# Patient Record
Sex: Female | Born: 1980 | Race: Black or African American | Hispanic: No | Marital: Single | Smoking: Never smoker
Health system: Southern US, Community
[De-identification: ages and names within clinical notes are randomized; demographics above are authoritative.]

---

## 2004-02-27 ENCOUNTER — Emergency Department: Payer: Self-pay | Admitting: General Practice

## 2004-09-19 ENCOUNTER — Emergency Department: Payer: Self-pay | Admitting: Emergency Medicine

## 2014-01-12 ENCOUNTER — Emergency Department: Payer: Self-pay | Admitting: Emergency Medicine

## 2014-01-15 LAB — BETA STREP CULTURE(ARMC)

## 2014-03-05 ENCOUNTER — Emergency Department: Payer: Self-pay | Admitting: Emergency Medicine

## 2015-01-11 ENCOUNTER — Emergency Department
Admission: EM | Admit: 2015-01-11 | Discharge: 2015-01-11 | Disposition: A | Discharge status: Home / Self Care | Payer: Medicaid Other | Attending: Student | Admitting: Student

## 2015-01-11 ENCOUNTER — Encounter: Payer: Self-pay | Admitting: Emergency Medicine

## 2015-01-11 DIAGNOSIS — J020 Streptococcal pharyngitis: Secondary | ICD-10-CM | POA: Diagnosis not present

## 2015-01-11 DIAGNOSIS — J029 Acute pharyngitis, unspecified: Secondary | ICD-10-CM | POA: Diagnosis present

## 2015-01-11 MED ORDER — AMOXICILLIN 500 MG PO CAPS
500.0000 mg | ORAL_CAPSULE | Freq: Once | ORAL | Status: AC
  Administered 2015-01-11: 500 mg via ORAL
  Filled 2015-01-11: qty 1

## 2015-01-11 MED ORDER — ACETAMINOPHEN 325 MG PO TABS
ORAL_TABLET | ORAL | Status: AC
  Administered 2015-01-11: 650 mg via ORAL
  Filled 2015-01-11: qty 2

## 2015-01-11 MED ORDER — ACETAMINOPHEN 500 MG PO TABS: 500.0000 mg | ORAL_TABLET | Freq: Four times a day (QID) | ORAL | Status: AC | PRN

## 2015-01-11 MED ORDER — ACETAMINOPHEN 325 MG PO TABS
650.0000 mg | ORAL_TABLET | Freq: Once | ORAL | Status: AC | PRN
  Administered 2015-01-11: 650 mg via ORAL

## 2015-01-11 MED ORDER — AMOXICILLIN 500 MG PO CAPS: 500.0000 mg | ORAL_CAPSULE | Freq: Two times a day (BID) | ORAL | Status: DC

## 2015-01-11 NOTE — ED Provider Notes (Addendum)
[facility] Emergency Department Provider Note  ____________________________________________  Time seen: Approximately 1:52 AM  I have reviewed the triage vital signs and the nursing notes.   HISTORY  Chief Complaint Sore Throat    HPI Betty Frazier is a 34 y.o. female with no chronic medical problems who presents for evaluation of 24 hours severe throat pain, gradual onset, constant since onset, worse with eating/swallowing. She has also had runny nose, nonproductive cough, myalgias, subjective fever. Her daughter is here with similar complaints. No vomiting or diarrhea. No chest pain or difficulty breathing. No neck pain or neck stiffness.   History reviewed. No pertinent past medical history.  There are no active problems to display for this patient.   History reviewed. No pertinent past surgical history.  No current outpatient prescriptions on file.  Allergies Review of patient's allergies indicates no known allergies.  History reviewed. No pertinent family history.  Social History Social History  Substance Use Topics  . Smoking status: Never Smoker   . Smokeless tobacco: None  . Alcohol Use: Yes    Review of Systems Constitutional: + subjective fever/chills Eyes: No visual changes. ENT: + sore throat. Cardiovascular: Denies chest pain. Respiratory: Denies shortness of breath. Gastrointestinal: No abdominal pain.  No nausea, no vomiting.  No diarrhea.  No constipation. Genitourinary: Negative for dysuria. Musculoskeletal: Negative for back pain. Skin: Negative for rash. Neurological: Negative for headaches, focal weakness or numbness.  10-point ROS otherwise negative.  ____________________________________________   PHYSICAL EXAM:  VITAL SIGNS: ED Triage Vitals  Enc Vitals Group     BP 01/11/15 0031 157/74 mmHg     Pulse Rate 01/11/15 0031 109     Resp 01/11/15 0031 22     Temp 01/11/15 0031 101.3 F (38.5 C)     Temp  Source 01/11/15 0031 Oral     SpO2 01/11/15 0031 94 %     Weight 01/11/15 0031 316 lb (143.337 kg)     Height 01/11/15 0031 5\' 7"  (1.702 m)     Head Cir --      Peak Flow --      Pain Score 01/11/15 0031 8     Pain Loc --      Pain Edu? --      Excl. in GC? --     Constitutional: Alert and oriented. Fatigued but nontoxic appearing and in no acute distress. Eyes: Conjunctivae are normal. PERRL. EOMI. Head: Atraumatic. Nose: No congestion/rhinnorhea. Mouth/Throat: Mucous membranes are moist.  Oropharynx is erythematous without exudate. Neck: No stridor.  Supple without meningismus. Cardiovascular: tachycardic rate, regular rhythm. Grossly normal heart sounds.  Good peripheral circulation. Respiratory: Normal respiratory effort.  No retractions. Lungs CTAB. Gastrointestinal: Soft and nontender. No distention.  No CVA tenderness. Genitourinary: deferred Musculoskeletal: No lower extremity tenderness nor edema.  No joint effusions. Neurologic:  Normal speech and language. No gross focal neurologic deficits are appreciated. No gait instability. Skin:  Skin is warm, dry and intact. No rash noted. Psychiatric: Mood and affect are normal. Speech and behavior are normal.  ____________________________________________   LABS (all labs ordered are listed, but only abnormal results are displayed)  Labs Reviewed - No data to display ____________________________________________  EKG  none ____________________________________________  RADIOLOGY  none ____________________________________________   PROCEDURES  Procedure(s) performed: None  Critical Care performed: No  ____________________________________________   INITIAL IMPRESSION / ASSESSMENT AND PLAN / ED COURSE  Pertinent labs & imaging results that were available during my care of the patient were reviewed  by me and considered in my medical decision making (see chart for details).  Betty Frazier is a 34 y.o. female  with no chronic medical problems who presents for evaluation of 24 hours severe throat pain, gradual onset, constant since onset, worse with eating/swallowing. On exam, she is nontoxic appearing and in no acute distress. Neck supple without meningismus and I doubt meningitis in this patient. On arrival she was febrile, tachycardic and tachypneic, however this has resolved with Tylenol. She has severe erythema of the oropharynx, no uvular deviation, symmetric tonsillar pillars, not consistent with peritonsillar abscess. Her strep test is positive. She has declined Bicillin. Will treat with amoxicillin. Discussed return precautions, need for  PCP follow-up and she is comfortable with the discharge plan. ____________________________________________   FINAL CLINICAL IMPRESSION(S) / ED DIAGNOSES  Final diagnoses:  Streptococcal pharyngitis      Gayla Doss, MD 01/11/15 4098  Gayla Doss, MD 01/22/15 1016

## 2015-01-11 NOTE — ED Notes (Signed)
POCT rapid strep result is positive

## 2015-01-11 NOTE — ED Notes (Signed)
Pt reports body aches and sore throat at work states has gotten worse

## 2015-01-12 LAB — POCT RAPID STREP A: STREPTOCOCCUS, GROUP A SCREEN (DIRECT): POSITIVE — AB

## 2015-02-11 ENCOUNTER — Encounter: Payer: Self-pay | Admitting: Emergency Medicine

## 2015-02-11 ENCOUNTER — Emergency Department
Admission: EM | Admit: 2015-02-11 | Discharge: 2015-02-11 | Disposition: A | Discharge status: Home / Self Care | Payer: No Typology Code available for payment source | Attending: Emergency Medicine | Admitting: Emergency Medicine

## 2015-02-11 DIAGNOSIS — Z792 Long term (current) use of antibiotics: Secondary | ICD-10-CM | POA: Insufficient documentation

## 2015-02-11 DIAGNOSIS — Y92410 Unspecified street and highway as the place of occurrence of the external cause: Secondary | ICD-10-CM | POA: Insufficient documentation

## 2015-02-11 DIAGNOSIS — Y9389 Activity, other specified: Secondary | ICD-10-CM | POA: Insufficient documentation

## 2015-02-11 DIAGNOSIS — Y998 Other external cause status: Secondary | ICD-10-CM | POA: Insufficient documentation

## 2015-02-11 DIAGNOSIS — M62830 Muscle spasm of back: Secondary | ICD-10-CM | POA: Insufficient documentation

## 2015-02-11 DIAGNOSIS — S3992XA Unspecified injury of lower back, initial encounter: Secondary | ICD-10-CM | POA: Insufficient documentation

## 2015-02-11 MED ORDER — NAPROXEN 500 MG PO TABS: 500.0000 mg | ORAL_TABLET | Freq: Two times a day (BID) | ORAL | Status: DC

## 2015-02-11 MED ORDER — CYCLOBENZAPRINE HCL 10 MG PO TABS: 10.0000 mg | ORAL_TABLET | Freq: Three times a day (TID) | ORAL | Status: DC | PRN

## 2015-02-11 NOTE — Discharge Instructions (Signed)
Heat Therapy °Heat therapy can help ease sore, stiff, injured, and tight muscles and joints. Heat relaxes your muscles, which may help ease your pain.  °RISKS AND COMPLICATIONS °If you have any of the following conditions, do not use heat therapy unless your health care provider has approved: °· Poor circulation. °· Healing wounds or scarred skin in the area being treated. °· Diabetes, heart disease, or high blood pressure. °· Not being able to feel (numbness) the area being treated. °· Unusual swelling of the area being treated. °· Active infections. °· Blood clots. °· Cancer. °· Inability to communicate pain. This may include young children and people who have problems with their brain function (dementia). °· Pregnancy. °Heat therapy should only be used on old, pre-existing, or long-lasting (chronic) injuries. Do not use heat therapy on new injuries unless directed by your health care provider. °HOW TO USE HEAT THERAPY °There are several different kinds of heat therapy, including: °· Moist heat pack. °· Warm water bath. °· Hot water bottle. °· Electric heating pad. °· Heated gel pack. °· Heated wrap. °· Electric heating pad. °Use the heat therapy method suggested by your health care provider. Follow your health care provider's instructions on when and how to use heat therapy. °GENERAL HEAT THERAPY RECOMMENDATIONS °· Do not sleep while using heat therapy. Only use heat therapy while you are awake. °· Your skin may turn pink while using heat therapy. Do not use heat therapy if your skin turns red. °· Do not use heat therapy if you have new pain. °· High heat or long exposure to heat can cause burns. Be careful when using heat therapy to avoid burning your skin. °· Do not use heat therapy on areas of your skin that are already irritated, such as with a rash or sunburn. °SEEK MEDICAL CARE IF: °· You have blisters, redness, swelling, or numbness. °· You have new pain. °· Your pain is worse. °MAKE SURE  YOU: °· Understand these instructions. °· Will watch your condition. °· Will get help right away if you are not doing well or get worse. °  °This information is not intended to replace advice given to you by your health care provider. Make sure you discuss any questions you have with your health care provider. °  °Document Released: 05/09/2011 Document Revised: 03/07/2014 Document Reviewed: 04/09/2013 °Elsevier Interactive Patient Education ©2016 Elsevier Inc[employer] ° °Muscle Cramps and Spasms °Muscle cramps and spasms occur when a muscle or muscles tighten and you have no control over this tightening (involuntary muscle contraction). They are a common problem and can develop in any muscle. The most common place is in the calf muscles of the leg. Both muscle cramps and muscle spasms are involuntary muscle contractions, but they also have differences:  °· Muscle cramps are sporadic and painful. They may last a few seconds to a quarter of an hour. Muscle cramps are often more forceful and last longer than muscle spasms. °· Muscle spasms may or may not be painful. They may also last just a few seconds or much longer. °CAUSES  °It is uncommon for cramps or spasms to be due to a serious underlying problem. In many cases, the cause of cramps or spasms is unknown. Some common causes are:  °· Overexertion.   °· Overuse from repetitive motions (doing the same thing over and over).   °· Remaining in a certain position for a long period of time.   °· Improper preparation, form, or technique while performing a sport or activity.   °·   Dehydration.   °· Injury.   °· Side effects of some medicines.   °· Abnormally low levels of the salts and ions in your blood (electrolytes), especially potassium and calcium. This could happen if you are taking water pills (diuretics) or you are pregnant.   °Some underlying medical problems can make it more likely to develop cramps or spasms. These include, but are not limited to:  °· Diabetes.    °· Parkinson disease.   °· Hormone disorders, such as thyroid problems.   °· Alcohol abuse.   °· Diseases specific to muscles, joints, and bones.   °· Blood vessel disease where not enough blood is getting to the muscles.   °HOME CARE INSTRUCTIONS  °· Stay well hydrated. Drink enough water and fluids to keep your urine clear or pale yellow. °· It may be helpful to massage, stretch, and relax the affected muscle. °· For tight or tense muscles, use a warm towel, heating pad, or hot shower water directed to the affected area. °· If you are sore or have pain after a cramp or spasm, applying ice to the affected area may relieve discomfort. °¨ Put ice in a plastic bag. °¨ Place a towel between your skin and the bag. °¨ Leave the ice on for 15-20 minutes, 03-04 times a day. °· Medicines used to treat a known cause of cramps or spasms may help reduce their frequency or severity. Only take over-the-counter or prescription medicines as directed by your caregiver. °SEEK MEDICAL CARE IF:  °Your cramps or spasms get more severe, more frequent, or do not improve over time.  °MAKE SURE YOU:  °· Understand these instructions. °· Will watch your condition. °· Will get help right away if you are not doing well or get worse. °  °This information is not intended to replace advice given to you by your health care provider. Make sure you discuss any questions you have with your health care provider. °  °Document Released: 08/06/2001 Document Revised: 06/11/2012 Document Reviewed: 02/01/2012 °Elsevier Interactive Patient Education ©2016 Elsevier Inc. ° °
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
## 2015-04-19 ENCOUNTER — Encounter: Payer: Self-pay | Admitting: Emergency Medicine

## 2015-04-19 ENCOUNTER — Emergency Department
Admission: EM | Admit: 2015-04-19 | Discharge: 2015-04-19 | Disposition: A | Discharge status: Home / Self Care | Payer: Medicaid Other | Attending: Emergency Medicine | Admitting: Emergency Medicine

## 2015-04-19 DIAGNOSIS — Z791 Long term (current) use of non-steroidal anti-inflammatories (NSAID): Secondary | ICD-10-CM | POA: Diagnosis not present

## 2015-04-19 DIAGNOSIS — R52 Pain, unspecified: Secondary | ICD-10-CM | POA: Diagnosis present

## 2015-04-19 DIAGNOSIS — J111 Influenza due to unidentified influenza virus with other respiratory manifestations: Secondary | ICD-10-CM | POA: Diagnosis not present

## 2015-04-19 DIAGNOSIS — Z792 Long term (current) use of antibiotics: Secondary | ICD-10-CM | POA: Diagnosis not present

## 2015-04-19 MED ORDER — OSELTAMIVIR PHOSPHATE 75 MG PO CAPS: 75.0000 mg | ORAL_CAPSULE | Freq: Two times a day (BID) | ORAL | Status: DC

## 2015-04-19 MED ORDER — GUAIFENESIN-CODEINE 100-10 MG/5ML PO SOLN: 5.0000 mL | ORAL | Status: DC | PRN

## 2015-04-19 NOTE — Discharge Instructions (Signed)
Influenza, Adult Influenza (flu) is an infection in the mouth, nose, and throat (respiratory tract) caused by a virus. The flu can make you feel very ill. Influenza spreads easily from person to person (contagious).  HOME CARE   Only take medicines as told by your doctor.  Use a cool mist humidifier to make breathing easier.  Get plenty of rest until your fever goes away. This usually takes 3 to 4 days.  Drink enough fluids to keep your pee (urine) clear or pale yellow.  Cover your mouth and nose when you cough or sneeze.  Wash your hands well to avoid spreading the flu.  Stay home from work or school until your fever has been gone for at least 1 full day.  Get a flu shot every year. GET HELP RIGHT AWAY IF:   You have trouble breathing or feel short of breath.  Your skin or nails turn blue.  You have severe neck pain or stiffness.  You have a severe headache, facial pain, or earache.  Your fever gets worse or keeps coming back.  You feel sick to your stomach (nauseous), throw up (vomit), or have watery poop (diarrhea).  You have chest pain.  You have a deep cough that gets worse, or you cough up more thick spit (mucus). MAKE SURE YOU:   Understand these instructions.  Will watch your condition.  Will get help right away if you are not doing well or get worse.   This information is not intended to replace advice given to you by your health care provider. Make sure you discuss any questions you have with your health care provider.   Document Released: 11/24/2007 Document Revised: 03/07/2014 Document Reviewed: 05/16/2011 Elsevier Interactive Patient Education 2016 [employer].    Tylenol or ibuprofen as seen for fever. Drink plenty of fluids. Begin Tamiflu today. Robitussin-AC as needed for cough. Follow-up with your doctor if any continued problems.

## 2015-04-19 NOTE — ED Provider Notes (Signed)
[facility] Emergency Department Provider Note  ____________________________________________  Time seen: Approximately 4:28 PM  I have reviewed the triage vital signs and the nursing notes.   HISTORY  Chief Complaint Generalized Body Aches  HPI Betty Frazier is a 35 y.o. female is here with onset last night of generalized body aches and feeling feverish. Patient states that she lives with her sister who was seen at [facility] and was positive for the flu. Sister is currently taking Tamiflu.At this time she denies any vomiting, diarrhea, or urinary symptoms. "I just ache all over". She rates her pain as 9/10. She also states that coughing has Her up all night.   History reviewed. No pertinent past medical history.  There are no active problems to display for this patient.   History reviewed. No pertinent past surgical history.  Current Outpatient Rx  Name  Route  Sig  Dispense  Refill  . acetaminophen (TYLENOL) 500 MG tablet   Oral   Take 1 tablet (500 mg total) by mouth every 6 (six) hours as needed.   20 tablet   2   . amoxicillin (AMOXIL) 500 MG capsule   Oral   Take 1 capsule (500 mg total) by mouth 2 (two) times daily.   20 capsule   0   . cyclobenzaprine (FLEXERIL) 10 MG tablet   Oral   Take 1 tablet (10 mg total) by mouth 3 (three) times daily as needed for muscle spasms.   15 tablet   0   . guaiFENesin-codeine 100-10 MG/5ML syrup   Oral   Take 5 mLs by mouth every 4 (four) hours as needed for cough.   120 mL   0   . naproxen (NAPROSYN) 500 MG tablet   Oral   Take 1 tablet (500 mg total) by mouth 2 (two) times daily with a meal.   60 tablet   0   . oseltamivir (TAMIFLU) 75 MG capsule   Oral   Take 1 capsule (75 mg total) by mouth 2 (two) times daily.   10 capsule   0     Allergies Review of patient's allergies indicates no known allergies.  History reviewed. No pertinent family history.  Social History Social  History  Substance Use Topics  . Smoking status: Never Smoker   . Smokeless tobacco: None  . Alcohol Use: Yes    Review of Systems Constitutional: Unaware fever/positive chills Eyes: No visual changes. ENT: No sore throat. Cardiovascular: Denies chest pain. Respiratory: Denies shortness of breath. Nonproductive cough Gastrointestinal: No abdominal pain.  No nausea, no vomiting.  No diarrhea.   Genitourinary: Negative for dysuria. Musculoskeletal: Wise body aches positive. Skin: Negative for rash. Neurological: Positive for headaches, no focal weakness or numbness.  10-point ROS otherwise negative.  ____________________________________________   PHYSICAL EXAM:  VITAL SIGNS: ED Triage Vitals  Enc Vitals Group     BP 04/19/15 1516 158/98 mmHg     Pulse Rate 04/19/15 1516 99     Resp 04/19/15 1516 20     Temp 04/19/15 1516 99.2 F (37.3 C)     Temp Source 04/19/15 1516 Oral     SpO2 04/19/15 1516 99 %     Weight 04/19/15 1516 235 lb (106.595 kg)     Height 04/19/15 1516  (1.676 m)     Head Cir --      Peak Flow --      Pain Score 04/19/15 1515 9     Pain Loc --  Pain Edu? --      Excl. in GC? --     Constitutional: Alert and oriented. Well appearing and in no acute distress. Eyes: Conjunctivae are normal. PERRL. EOMI. Head: Atraumatic. Nose: No congestion/rhinnorhea.  EACs and TMs are clear bilaterally. Mouth/Throat: Mucous membranes are moist.  Oropharynx non-erythematous. Neck: No stridor.   Hematological/Lymphatic/Immunilogical: No cervical lymphadenopathy. Cardiovascular: Normal rate, regular rhythm. Grossly normal heart sounds.  Good peripheral circulation. Respiratory: Normal respiratory effort.  No retractions. Lungs CTAB. Gastrointestinal: Soft and nontender. No distention.  Musculoskeletal: Moves upper and lower extremities without any difficulty. Neurologic:  Normal speech and language. No gross focal neurologic deficits are appreciated. No gait  instability. Skin:  Skin is warm, dry and intact. No rash noted. Psychiatric: Mood and affect are normal. Speech and behavior are normal.  ____________________________________________   LABS (all labs ordered are listed, but only abnormal results are displayed)  Labs Reviewed - No data to display  PROCEDURES  Procedure(s) performed: None  Critical Care performed: No  ____________________________________________   INITIAL IMPRESSION / ASSESSMENT AND PLAN / ED COURSE  Pertinent labs & imaging results that were available during my care of the patient were reviewed by me and considered in my medical decision making (see chart for details).  Patient was started on Tamiflu due to the close proximity to a family member that is positive. Patient was also given a prescription for Robitussin-AC as needed for cough. She is encouraged to continue fluids and take Tylenol or ibuprofen as needed for fever and body aches. She is to follow-up with her doctor if any continued problems. ____________________________________________   FINAL CLINICAL IMPRESSION(S) / ED DIAGNOSES  Final diagnoses:  Influenza      Tommi Rumps, PA-C 04/19/15 1712  Sharman Cheek, MD 04/22/15 1221

## 2015-04-19 NOTE — ED Notes (Signed)
Pt c/o generalized body aches since last night. Sister who lives with pt has had flu. Denies fevers. Denies NVD.

## 2016-05-30 ENCOUNTER — Encounter: Payer: Self-pay | Admitting: Emergency Medicine

## 2016-05-30 ENCOUNTER — Emergency Department
Admission: EM | Admit: 2016-05-30 | Discharge: 2016-05-30 | Disposition: A | Discharge status: Home / Self Care | Payer: Medicaid Other | Attending: Student in an Organized Health Care Education/Training Program | Admitting: Student in an Organized Health Care Education/Training Program

## 2016-05-30 DIAGNOSIS — J069 Acute upper respiratory infection, unspecified: Secondary | ICD-10-CM

## 2016-05-30 DIAGNOSIS — R05 Cough: Secondary | ICD-10-CM | POA: Diagnosis present

## 2016-05-30 LAB — POCT RAPID STREP A: Streptococcus, Group A Screen (Direct): NEGATIVE

## 2016-05-30 MED ORDER — PREDNISONE 10 MG (21) PO TBPK: ORAL_TABLET | ORAL | 0 refills | Status: DC

## 2016-05-30 MED ORDER — AMOXICILLIN-POT CLAVULANATE 875-125 MG PO TABS: 1.0000 | ORAL_TABLET | Freq: Two times a day (BID) | ORAL | 0 refills | Status: AC

## 2016-05-30 NOTE — ED Notes (Signed)
See triage note  States she developed sinus pressure last week  States she took OTC sinus meds and felt better  But woke up with sore throat ,and soreness in neck this am    Positive hoarseness

## 2016-05-30 NOTE — ED Provider Notes (Signed)
[facility] Emergency Department Provider Note  ____________________________________________  Time seen: Approximately 8:22 AM  I have reviewed the triage vital signs and the nursing notes.   HISTORY  Chief Complaint Sore Throat    HPI Betty Frazier is a 36 y.o. female that presents to the emergency department with 3 weeks of congestion, 5 days of non productive cough, and one day of sore throat. Patient states that the congestion has come and go for 3 weeks. A couple of days ago she developed a nonproductive cough. This morning she woke up with a hoarse voice and a sore throat.She has taken over-the-counter cold medication for symptoms. She denies fever, chills, muscle aches, shortness of breath, chest pain, nausea, vomiting, abdominal pain.   History reviewed. No pertinent past medical history.  There are no active problems to display for this patient.   History reviewed. No pertinent surgical history.  Prior to Admission medications   Medication Sig Start Date End Date Taking? Authorizing Provider  amoxicillin-clavulanate (AUGMENTIN) 875-125 MG tablet Take 1 tablet by mouth 2 (two) times daily. 05/30/16 06/09/16  Enid Derry, PA-C  predniSONE (STERAPRED UNI-PAK 21 TAB) 10 MG (21) TBPK tablet Take 6 tablets on day 1, take 5 tablets on day 2, take 4 tablets on day 3, take 3 tablets on day 4, take 2 tablets on day 5, take 1 tablet on day 6 05/30/16   Enid Derry, PA-C    Allergies Patient has no known allergies.  No family history on file.  Social History Social History  Substance Use Topics  . Smoking status: Never Smoker  . Smokeless tobacco: Never Used  . Alcohol use Yes     Review of Systems  Constitutional: No fever/chills Eyes: No visual changes. No discharge. ENT: Positive for congestion and rhinorrhea. Cardiovascular: No chest pain. Respiratory: Positive for cough. No SOB. Gastrointestinal: No abdominal pain.  No nausea, no  vomiting.  No diarrhea.  No constipation. Musculoskeletal: Negative for musculoskeletal pain. Skin: Negative for rash, abrasions, lacerations, ecchymosis. Neurological: Negative for headaches.   ____________________________________________   PHYSICAL EXAM:  VITAL SIGNS: ED Triage Vitals [05/30/16 0752]  Enc Vitals Group     BP (!) 158/92     Pulse Rate 76     Resp 18     Temp 98.4 F (36.9 C)     Temp Source Oral     SpO2 96 %     Weight 240 lb (108.9 kg)     Height  (1.676 m)     Head Circumference      Peak Flow      Pain Score      Pain Loc      Pain Edu?      Excl. in GC?      Constitutional: Alert and oriented. Well appearing and in no acute distress. Eyes: Conjunctivae are normal. PERRL. EOMI. No discharge. Head: Atraumatic. ENT: No frontal and maxillary sinus tenderness.      Ears: Tympanic membranes pearly gray with good landmarks. No discharge.      Nose: Mild congestion/rhinnorhea.      Mouth/Throat: Mucous membranes are moist. Oropharynx non-erythematous. Tonsils not enlarged. No exudates. Uvula midline. Neck: No stridor.   Hematological/Lymphatic/Immunilogical: No cervical lymphadenopathy. Cardiovascular: Normal rate, regular rhythm.  Good peripheral circulation. Respiratory: Normal respiratory effort without tachypnea or retractions. Lungs CTAB. Good air entry to the bases with no decreased or absent breath sounds. Gastrointestinal: Bowel sounds 4 quadrants. Soft and nontender to palpation. No  guarding or rigidity. No palpable masses. No distention. Musculoskeletal: Full range of motion to all extremities. No gross deformities appreciated. Neurologic:  Normal speech and language. No gross focal neurologic deficits are appreciated.  Skin:  Skin is warm, dry and intact. No rash noted.   ____________________________________________   LABS (all labs ordered are listed, but only abnormal results are displayed)  Labs Reviewed  POCT RAPID STREP A    ____________________________________________  EKG   ____________________________________________  RADIOLOGY  No results found.  ____________________________________________    PROCEDURES  Procedure(s) performed:    Procedures    Medications - No data to display   ____________________________________________   INITIAL IMPRESSION / ASSESSMENT AND PLAN / ED COURSE  Pertinent labs & imaging results that were available during my care of the patient were reviewed by me and considered in my medical decision making (see chart for details).  Review of the  CSRS was performed in accordance of the [facility] prior to dispensing any controlled drugs.     Patient's diagnosis is consistent with upper respiratory infection. Vital signs and exam are reassuring. Strep negative. Patient appears well and is staying well hydrated. Patient feels comfortable going home. Patient will be discharged home with prescriptions for Augmentin and prednisone. Patient is to follow up with PCP as needed or otherwise directed. Patient is given ED precautions to return to the ED for any worsening or new symptoms.     ____________________________________________  FINAL CLINICAL IMPRESSION(S) / ED DIAGNOSES  Final diagnoses:  Upper respiratory tract infection, unspecified type      NEW MEDICATIONS STARTED DURING THIS VISIT:  New Prescriptions   AMOXICILLIN-CLAVULANATE (AUGMENTIN) 875-125 MG TABLET    Take 1 tablet by mouth 2 (two) times daily.   PREDNISONE (STERAPRED UNI-PAK 21 TAB) 10 MG (21) TBPK TABLET    Take 6 tablets on day 1, take 5 tablets on day 2, take 4 tablets on day 3, take 3 tablets on day 4, take 2 tablets on day 5, take 1 tablet on day 6        This chart was dictated using voice recognition software/Dragon. Despite best efforts to proofread, errors can occur which can change the meaning. Any change was purely unintentional.    Enid Derry, PA-C 05/30/16 6440     Willy Eddy, MD 05/30/16 [phone]

## 2016-05-30 NOTE — ED Triage Notes (Signed)
Pt with sore throat.

## 2016-07-07 ENCOUNTER — Emergency Department
Admission: EM | Admit: 2016-07-07 | Discharge: 2016-07-07 | Disposition: A | Discharge status: Home / Self Care | Payer: Medicaid Other | Attending: Emergency Medicine | Admitting: Emergency Medicine

## 2016-07-07 DIAGNOSIS — H1032 Unspecified acute conjunctivitis, left eye: Secondary | ICD-10-CM | POA: Insufficient documentation

## 2016-07-07 DIAGNOSIS — J069 Acute upper respiratory infection, unspecified: Secondary | ICD-10-CM | POA: Insufficient documentation

## 2016-07-07 DIAGNOSIS — J029 Acute pharyngitis, unspecified: Secondary | ICD-10-CM | POA: Diagnosis present

## 2016-07-07 LAB — POCT RAPID STREP A: Streptococcus, Group A Screen (Direct): NEGATIVE

## 2016-07-07 MED ORDER — GUAIFENESIN-CODEINE 100-10 MG/5ML PO SYRP: 5.0000 mL | ORAL_SOLUTION | Freq: Three times a day (TID) | ORAL | 0 refills | Status: AC | PRN

## 2016-07-07 MED ORDER — LIDOCAINE VISCOUS 2 % MT SOLN: 10.0000 mL | OROMUCOSAL | 0 refills | Status: DC | PRN

## 2016-07-07 MED ORDER — ERYTHROMYCIN 5 MG/GM OP OINT: TOPICAL_OINTMENT | Freq: Three times a day (TID) | OPHTHALMIC | 0 refills | Status: AC

## 2016-07-07 NOTE — ED Provider Notes (Signed)
[facility] Emergency Department Provider Note  ____________________________________________  Time seen: Approximately 4:58 PM  I have reviewed the triage vital signs and the nursing notes.   HISTORY  Chief Complaint Sore Throat and Generalized Body Aches    HPI Betty Frazier is a 36 y.o. female that presents to emergency department with chills, muscle aches, red eye, sore throat, and nonproductive coughfor 1 day. Patient states that redness in right eye started around 4 hours ago.  Eye itches and has been draining clear fluid. No trauma. She has had difficulty eating today due to pain in throat, but she has had several bottles of Gatorade. She has not checked her temperature. She alternates between diarrhea and constipation and this is not new. No sick contacts. She does not smoke. No asthma or allergies. She denies shortness of breath, chest pain, nausea, vomiting, abdominal pain.   History reviewed. No pertinent past medical history.  There are no active problems to display for this patient.   History reviewed. No pertinent surgical history.  Prior to Admission medications   Medication Sig Start Date End Date Taking? Authorizing Provider  erythromycin [facility](ROMYCIN) ophthalmic ointment Place into the right eye 3 (three) times daily. Place a 1/2 inch ribbon of ointment into the lower eyelid. 07/07/16 07/17/16  Enid DerryWagner, Jamiria Langill, PA-C  guaiFENesin-codeine (ROBITUSSIN AC) 100-10 MG/5ML syrup Take 5 mLs by mouth 3 (three) times daily as needed for cough. 07/07/16 07/09/16  Enid DerryWagner, Nussen Pullin, PA-C  lidocaine (XYLOCAINE) 2 % solution Use as directed 10 mLs in the mouth or throat as needed for mouth pain. 07/07/16   Enid DerryWagner, Krysteena Stalker, PA-C  predniSONE (STERAPRED UNI-PAK 21 TAB) 10 MG (21) TBPK tablet Take 6 tablets on day 1, take 5 tablets on day 2, take 4 tablets on day 3, take 3 tablets on day 4, take 2 tablets on day 5, take 1 tablet on day 6 05/30/16   Enid DerryWagner, Delesha Pohlman, PA-C     Allergies Patient has no known allergies.  No family history on file.  Social History Social History  Substance Use Topics  . Smoking status: Never Smoker  . Smokeless tobacco: Never Used  . Alcohol use Yes     Review of Systems  Constitutional: No fever/chills ENT: Negative for congestion and rhinorrhea. Cardiovascular: No chest pain. Respiratory: Positive for cough. No SOB. Gastrointestinal: No abdominal pain.  No nausea, no vomiting.  No diarrhea.  No constipation. Musculoskeletal: Negative for musculoskeletal pain. Skin: Negative for rash, abrasions, lacerations, ecchymosis. Neurological: Negative for headaches.   ____________________________________________   PHYSICAL EXAM:  VITAL SIGNS: ED Triage Vitals  Enc Vitals Group     BP 07/07/16 1630 (!) 160/91     Pulse Rate 07/07/16 1630 93     Resp 07/07/16 1630 20     Temp 07/07/16 1630 99.2 F (37.3 C)     Temp Source 07/07/16 1630 Oral     SpO2 07/07/16 1630 96 %     Weight 07/07/16 1624 300 lb (136.1 kg)     Height 07/07/16 1624 5\' 6"  (1.676 m)     Head Circumference --      Peak Flow --      Pain Score 07/07/16 1624 8     Pain Loc --      Pain Edu? --      Excl. in GC? --      Constitutional: Alert and oriented. Well appearing and in no acute distress. Eyes: Conjunctivae are normal. PERRL. EOMI. No discharge. Head: Atraumatic.  ENT: No frontal and maxillary sinus tenderness.      Ears: Tympanic membranes pearly gray with good landmarks. No discharge.      Nose: No congestion/rhinnorhea.      Mouth/Throat: Mucous membranes are moist. Oropharynx non-erythematous. Tonsils not enlarged. No exudates. Uvula midline. Neck: No stridor.   Hematological/Lymphatic/Immunilogical: No cervical lymphadenopathy. Cardiovascular: Normal rate, regular rhythm.  Good peripheral circulation. Respiratory: Normal respiratory effort without tachypnea or retractions. Lungs CTAB. Good air entry to the bases with no  decreased or absent breath sounds. Gastrointestinal: Bowel sounds 4 quadrants. Soft and nontender to palpation. No guarding or rigidity. No palpable masses. No distention. Musculoskeletal: Full range of motion to all extremities. No gross deformities appreciated. Neurologic:  Normal speech and language. No gross focal neurologic deficits are appreciated.  Skin:  Skin is warm, dry and intact. No rash noted.   ____________________________________________   LABS (all labs ordered are listed, but only abnormal results are displayed)  Labs Reviewed  POCT RAPID STREP A   ____________________________________________  EKG   ____________________________________________  RADIOLOGY  No results found.  ____________________________________________    PROCEDURES  Procedure(s) performed:    Procedures    Medications - No data to display   ____________________________________________   INITIAL IMPRESSION / ASSESSMENT AND PLAN / ED COURSE  Pertinent labs & imaging results that were available during my care of the patient were reviewed by me and considered in my medical decision making (see chart for details).  Review of the [place] CSRS was performed in accordance of the [facility] prior to dispensing any controlled drugs.  Patient's diagnosis is consistent with viral upper respiratory infection and conjunctivitis. Vital signs and exam are reassuring. Strep negative. I discussed with patient that conjunctivitis is likely viral but I will write her a prescription for antibiotic ointment and she should take it only if symptoms do not improve over the weekend. Patient appears well and is staying well hydrated. Patient feels comfortable going home. Patient will be discharged home with prescriptions for viscous lidocaine, Robitussin, erythromycin ointment. Patient is to follow up with PCP as needed or otherwise directed. Patient is given ED precautions to return to the ED for any worsening or new  symptoms.   ____________________________________________  FINAL CLINICAL IMPRESSION(S) / ED DIAGNOSES  Final diagnoses:  Viral upper respiratory tract infection  Acute conjunctivitis of left eye, unspecified acute conjunctivitis type      NEW MEDICATIONS STARTED DURING THIS VISIT:  New Prescriptions   ERYTHROMYCIN (ROMYCIN) OPHTHALMIC OINTMENT    Place into the right eye 3 (three) times daily. Place a 1/2 inch ribbon of ointment into the lower eyelid.   GUAIFENESIN-CODEINE (ROBITUSSIN AC) 100-10 MG/5ML SYRUP    Take 5 mLs by mouth 3 (three) times daily as needed for cough.   LIDOCAINE (XYLOCAINE) 2 % SOLUTION    Use as directed 10 mLs in the mouth or throat as needed for mouth pain.        This chart was dictated using voice recognition software/Dragon. Despite best efforts to proofread, errors can occur which can change the meaning. Any change was purely unintentional.    Enid Derry, PA-C 07/07/16 1813    Minna Antis, MD 07/07/16 2204

## 2016-07-07 NOTE — ED Triage Notes (Signed)
Sore throat and body aches X 2 days. Left eye redness. Pt alert and oriented X4, active, cooperative, pt in NAD. RR even and unlabored, color WNL.

## 2016-07-07 NOTE — ED Notes (Signed)
Pt c/o sore throat and generalized weakness since yesterday; denies any fever, shortness of breath, dizziness, nausea, vomiting, diarrhea; pt is awake, alert and oriented x4

## 2016-07-26 ENCOUNTER — Emergency Department
Admission: EM | Admit: 2016-07-26 | Discharge: 2016-07-26 | Disposition: A | Discharge status: Home / Self Care | Payer: Medicaid Other | Attending: Student in an Organized Health Care Education/Training Program | Admitting: Student in an Organized Health Care Education/Training Program

## 2016-07-26 ENCOUNTER — Encounter: Payer: Self-pay | Admitting: Emergency Medicine

## 2016-07-26 DIAGNOSIS — Y929 Unspecified place or not applicable: Secondary | ICD-10-CM | POA: Insufficient documentation

## 2016-07-26 DIAGNOSIS — Y93G1 Activity, food preparation and clean up: Secondary | ICD-10-CM | POA: Insufficient documentation

## 2016-07-26 DIAGNOSIS — S61411A Laceration without foreign body of right hand, initial encounter: Secondary | ICD-10-CM | POA: Insufficient documentation

## 2016-07-26 DIAGNOSIS — W260XXA Contact with knife, initial encounter: Secondary | ICD-10-CM | POA: Insufficient documentation

## 2016-07-26 DIAGNOSIS — Y999 Unspecified external cause status: Secondary | ICD-10-CM | POA: Diagnosis not present

## 2016-07-26 MED ORDER — TETANUS-DIPHTH-ACELL PERTUSSIS 5-2.5-18.5 LF-MCG/0.5 IM SUSP
0.5000 mL | Freq: Once | INTRAMUSCULAR | Status: AC
  Administered 2016-07-26: 0.5 mL via INTRAMUSCULAR
  Filled 2016-07-26: qty 0.5

## 2016-07-26 MED ORDER — LIDOCAINE HCL (PF) 1 % IJ SOLN
5.0000 mL | Freq: Once | INTRAMUSCULAR | Status: AC
  Administered 2016-07-26: 5 mL
  Filled 2016-07-26: qty 5

## 2016-07-26 MED ORDER — NAPROXEN 500 MG PO TABS
500.0000 mg | ORAL_TABLET | Freq: Once | ORAL | Status: AC
  Administered 2016-07-26: 500 mg via ORAL
  Filled 2016-07-26: qty 1

## 2016-07-26 NOTE — Discharge Instructions (Signed)
Keep wound clean and dry. When at work keep clean, dry and covered. Return the the emergency department or your PCP for suture removal in 10 days. If the wounds begins to show signs of infection: redness, warmth, drainage, etc return to the emergency department.

## 2016-07-26 NOTE — ED Provider Notes (Signed)
[facility] Emergency Department Provider Note   ____________________________________________   I have reviewed the triage vital signs and the nursing notes.   HISTORY  Chief Complaint Laceration    HPI Betty Frazier is a 36 y.o. female presents with laceration along the hyperthenar eminence of the right hand secondary to cutting her hand with a knife while washing dishes. Patient described the knife entering the lateral aspect, hypothenar, of her hand and piercing through the palmar region of her hand. Patient noted intact movement and sensation medially following the injury. Patient does not know the status of her tetanus vaccine.    History reviewed. No pertinent past medical history.  There are no active problems to display for this patient.   History reviewed. No pertinent surgical history.  Prior to Admission medications   Medication Sig Start Date End Date Taking? Authorizing Provider  lidocaine (XYLOCAINE) 2 % solution Use as directed 10 mLs in the mouth or throat as needed for mouth pain. 07/07/16   Enid DerryWagner, Ashley, PA-C  predniSONE (STERAPRED UNI-PAK 21 TAB) 10 MG (21) TBPK tablet Take 6 tablets on day 1, take 5 tablets on day 2, take 4 tablets on day 3, take 3 tablets on day 4, take 2 tablets on day 5, take 1 tablet on day 6 05/30/16   Enid DerryWagner, Ashley, PA-C    Allergies Patient has no known allergies.  No family history on file.  Social History Social History  Substance Use Topics  . Smoking status: Never Smoker  . Smokeless tobacco: Never Used  . Alcohol use Yes    Review of Systems Constitutional: Negative for fever/chills Cardiovascular: Denies chest pain. Musculoskeletal:Negative for generalized body aches. Skin: Negative for rash. Laceration to the lateral right hand ____________________________________________   PHYSICAL EXAM:  VITAL SIGNS: ED Triage Vitals  Enc Vitals Group     BP 07/26/16 1801 (!) 162/106   Pulse Rate 07/26/16 1801 86     Resp 07/26/16 1801 20     Temp 07/26/16 1801 98 F (36.7 C)     Temp Source 07/26/16 1801 Oral     SpO2 07/26/16 1801 95 %     Weight 07/26/16 1756 (!) 315 lb (142.9 kg)     Height 07/26/16 1756 5\' 7"  (1.702 m)     Head Circumference --      Peak Flow --      Pain Score 07/26/16 1756 7     Pain Loc --      Pain Edu? --      Excl. in GC? --     Constitutional: Alert and oriented. Well appearing and in no acute distress.  Cardiovascular: Normal rate, regular rhythm. Normal distal pulses. Respiratory: Normal respiratory effort. Musculoskeletal: Nontender with normal range of motion in all extremities. Right hand and digits range of motion full without pain. Sensation to the right hand intact. Right fifth digit flexor and extension tendons intact. Neurologic: Normal speech and language.  No sensory loss noted. Skin:  Skin is warm, dry and intact. Right hand lateral aspect laceration approximately 2 cm and less than half a centimeter laceration in the hypothenar palmar aspect of the right hand. No foreign bodies noted. No rash noted. Psychiatric: Mood and affect are normal.  ____________________________________________   LABS (all labs ordered are listed, but only abnormal results are displayed)  Labs Reviewed - No data to display ____________________________________________  EKG none ____________________________________________  RADIOLOGY none ____________________________________________   PROCEDURES  Procedure(s) performed: LACERATION REPAIR Performed  by: Clois Comber Authorized by: Clois Comber Consent: Verbal consent obtained. Risks and benefits: risks, benefits and alternatives were discussed Consent given by: patient Patient identity confirmed: provided demographic data Prepped and Draped in normal sterile fashion Wound explored  Laceration Location: 1-Right hand lateral aspect approximately hyporthenar region and 2-palmar  aspect hyporthenar region.  Laceration Length: 2.0 cm and ~0.5 cm  No Foreign Bodies seen or palpated  Anesthesia: local infiltration  Local anesthetic: lidocaine 1%   Anesthetic total: 3.0 ml  Irrigation method: syringe Amount of cleaning: standard  Skin closure: Ethilon 4.0  Number of sutures: 3 sutures  Technique: simple interrupted.   Patient tolerance: Patient tolerated the procedure well with no immediate complications.  (2)-Palmar hypothenar laceration Dermabond with (2) steri-strips.   Critical Care performed: no ____________________________________________   INITIAL IMPRESSION / ASSESSMENT AND PLAN / ED COURSE  Pertinent labs & imaging results that were available during my care of the patient were reviewed by me and considered in my medical decision making (see chart for details).   Patient sustained a laceration right hand lateral aspect of hyporthenar and palmar region of hypothenar.  Assessment confirmed movement and sensation of the hand region and fifth digit before and after wound closure. Flexor and extension tendons of the fifth digit intact Laceration required suture closure and Dermabond as noted above. Patient tolerated procedure well. Pt instructed to keep wound clean and dry and will return to the emergency department or PCP for suture removal in 10 days. Patient also instructed to watch for signs of infection and return if he noted changes of such.  Patient and Caregiver informed of clinical course, understand medical decision-making process, and agree with plan.  Patient was advised to return to the emergency department for symptoms that change or worsen.     ____________________________________________   FINAL CLINICAL IMPRESSION(S) / ED DIAGNOSES  Final diagnoses:  Laceration of right hand without foreign body, initial encounter       NEW MEDICATIONS STARTED DURING THIS VISIT:  Discharge Medication List as of 07/26/2016  7:17 PM        Note:  This document was prepared using Dragon voice recognition software and may include unintentional dictation errors.   Percell Boston 07/26/16 1940    Willy Eddy, MD 07/26/16 [phone]

## 2016-07-26 NOTE — ED Triage Notes (Signed)
Pt with small laceration to right hand while washing dishes.

## 2016-07-26 NOTE — ED Notes (Signed)
See triage note  States she was washing dishes this afternoon   And knife went into right hand   Laceration noted near thumb area

## 2016-08-11 ENCOUNTER — Emergency Department
Admission: EM | Admit: 2016-08-11 | Discharge: 2016-08-12 | Disposition: A | Discharge status: Home / Self Care | Payer: Medicaid Other | Source: Home / Self Care | Attending: Emergency Medicine | Admitting: Emergency Medicine

## 2016-08-11 DIAGNOSIS — T148XXA Other injury of unspecified body region, initial encounter: Principal | ICD-10-CM

## 2016-08-11 DIAGNOSIS — L089 Local infection of the skin and subcutaneous tissue, unspecified: Secondary | ICD-10-CM

## 2016-08-11 DIAGNOSIS — L02511 Cutaneous abscess of right hand: Secondary | ICD-10-CM | POA: Diagnosis not present

## 2016-08-11 NOTE — ED Notes (Signed)
Patient is here tonight for a wound recheck, she was previously treated for hand lac by kitchen knife while dishwashing.  Tonight she noticed the site is swollen and red.  She reports it "feels hard" on the top of her hand but "it feels soft" at the wound site.  Patient reports she has not had any trouble with bleeding or weeping while she has had the sutures in.  She is reporting 7/10 pain from the site.  Wound feels warm to touch with slight redness noted around the wound.

## 2016-08-11 NOTE — ED Triage Notes (Signed)
Pt had stitches put into R hand 11 days ago.  Pt states that she noticed today that area around stiches is red and warm to touch, swelling noted by this RN as well.  Pt states that there is pain around the site as well.  Pt denies being given antibiotics to take after stitches placed.  Pt A&Ox4, ambulatory to triage.

## 2016-08-12 ENCOUNTER — Telehealth: Payer: Self-pay | Admitting: Emergency Medicine

## 2016-08-12 ENCOUNTER — Emergency Department
Admission: EM | Admit: 2016-08-12 | Discharge: 2016-08-12 | Disposition: A | Discharge status: Home / Self Care | Payer: Medicaid Other | Attending: Emergency Medicine | Admitting: Emergency Medicine

## 2016-08-12 DIAGNOSIS — L02511 Cutaneous abscess of right hand: Secondary | ICD-10-CM | POA: Insufficient documentation

## 2016-08-12 DIAGNOSIS — L0291 Cutaneous abscess, unspecified: Secondary | ICD-10-CM

## 2016-08-12 MED ORDER — OXYCODONE-ACETAMINOPHEN 5-325 MG PO TABS: 1.0000 | ORAL_TABLET | ORAL | 0 refills | Status: DC | PRN

## 2016-08-12 MED ORDER — CEPHALEXIN 500 MG PO CAPS
500.0000 mg | ORAL_CAPSULE | Freq: Once | ORAL | Status: AC
  Administered 2016-08-12: 500 mg via ORAL
  Filled 2016-08-12: qty 1

## 2016-08-12 MED ORDER — CEPHALEXIN 500 MG PO CAPS: 500.0000 mg | ORAL_CAPSULE | Freq: Two times a day (BID) | ORAL | 0 refills | Status: AC

## 2016-08-12 MED ORDER — KETOROLAC TROMETHAMINE 10 MG PO TABS
10.0000 mg | ORAL_TABLET | Freq: Once | ORAL | Status: AC
  Administered 2016-08-12: 10 mg via ORAL
  Filled 2016-08-12: qty 1

## 2016-08-12 NOTE — ED Notes (Signed)
Pt verbalized understanding of discharge instructions. NAD at this time. 

## 2016-08-12 NOTE — ED Provider Notes (Signed)
[facility] Emergency Department Provider Note    First MD Initiated Contact with Patient 08/12/16 [phone]     (approximate)  I have reviewed the triage vital signs and the nursing notes.   HISTORY  Chief Complaint Wound Check   HPI Betty Frazier is a 36 y.o. female presents with request for wound check of right hand wound. Patient was seen on 07/26/2016 secondary to a laceration to the hyper thenar eminence of her right hand which occurred while washing dishes. Patient states that she has noticed increased pain and swelling and erythema in the area for the past 2 days. Patient denies any fever afebrile on presentation. Of note sutures are still in place.   History reviewed. No pertinent past medical history.  There are no active problems to display for this patient.   History reviewed. No pertinent surgical history.  Prior to Admission medications   Medication Sig Start Date End Date Taking? Authorizing Provider  lidocaine (XYLOCAINE) 2 % solution Use as directed 10 mLs in the mouth or throat as needed for mouth pain. 07/07/16   Enid DerryWagner, Ashley, PA-C  predniSONE (STERAPRED UNI-PAK 21 TAB) 10 MG (21) TBPK tablet Take 6 tablets on day 1, take 5 tablets on day 2, take 4 tablets on day 3, take 3 tablets on day 4, take 2 tablets on day 5, take 1 tablet on day 6 05/30/16   Enid DerryWagner, Ashley, PA-C    Allergies Patient has no known allergies.  No family history on file.  Social History Social History  Substance Use Topics  . Smoking status: Never Smoker  . Smokeless tobacco: Never Used  . Alcohol use Yes    Review of Systems Constitutional: No fever/chills Eyes: No visual changes. ENT: No sore throat. Cardiovascular: Denies chest pain. Respiratory: Denies shortness of breath. Gastrointestinal: No abdominal pain.  No nausea, no vomiting.  No diarrhea.  No constipation. Genitourinary: Negative for dysuria. Musculoskeletal: Negative for neck pain.   Negative for back pain. Integumentary: Negative for rash.Positive for right hand laceration Neurological: Negative for headaches, focal weakness or numbness.   ____________________________________________   PHYSICAL EXAM:  VITAL SIGNS: ED Triage Vitals [08/11/16 2220]  Enc Vitals Group     BP (!) 147/94     Pulse Rate 96     Resp 18     Temp 99 F (37.2 C)     Temp Source Oral     SpO2 98 %     Weight (!) 142.9 kg (315 lb)     Height 1.702 m (5\' 7" )     Head Circumference      Peak Flow      Pain Score 7     Pain Loc      Pain Edu?      Excl. in GC?     Constitutional: Alert and oriented. Well appearing and in no acute distress. Eyes: Conjunctivae are normal. Head: Atraumatic. Mouth/Throat: Mucous membranes are moist.  Oropharynx non-erythematous. Neck: No stridor.   Cardiovascular: Normal rate, regular rhythm. Good peripheral circulation. Grossly normal heart sounds. Respiratory: Normal respiratory effort.  No retractions. Lungs CTAB. Gastrointestinal: Soft and nontender. No distention.  Musculoskeletal: No lower extremity tenderness nor edema. No gross deformities of extremities. Neurologic:  Normal speech and language. No gross focal neurologic deficits are appreciated.  Skin:  Healing 2cm linear solution to the hypothenar eminence of the right hand with surrounding swelling and erythema. No drainage noted at this time area is tender to touch.  Psychiatric: Mood and affect are normal. Speech and behavior are normal.   Procedures   ____________________________________________   INITIAL IMPRESSION / ASSESSMENT AND PLAN / ED COURSE  Pertinent labs & imaging results that were available during my care of the patient were reviewed by me and considered in my medical decision making (see chart for details).  Spoke with the patient at length regarding wound infection signs of improvement in signs of worsening which warm return to the emergency department. Patient given  Keflex in the emergency department will be prescribed the same for home in addition patient given 6 tablets of Percocet for home for pain.      ____________________________________________  FINAL CLINICAL IMPRESSION(S) / ED DIAGNOSES  Final diagnoses:  Wound infection     MEDICATIONS GIVEN DURING THIS VISIT:  Medications  cephALEXin (KEFLEX) capsule 500 mg (500 mg Oral Given 08/12/16 0106)  ketorolac (TORADOL) tablet 10 mg (10 mg Oral Given 08/12/16 0106)     NEW OUTPATIENT MEDICATIONS STARTED DURING THIS VISIT:  New Prescriptions   No medications on file    Modified Medications   No medications on file    Discontinued Medications   No medications on file     Note:  This document was prepared using Dragon voice recognition software and may include unintentional dictation errors.    Darci Current, MD 08/12/16 [phone]

## 2016-08-12 NOTE — ED Provider Notes (Signed)
[facility] Emergency Department Provider Note   ____________________________________________    I have reviewed the triage vital signs and the nursing notes.   HISTORY  Chief Complaint Abscess     HPI Betty Frazier is a 36 y.o. female who presents with complaint of draining wound to the right hand. Patient was seen last night and prescribed antibiotics for an abscess but today she reports she knows it draining and became concerned. She denies fevers or chills. She has not taken any antibiotics. No new swelling or erythema.   History reviewed. No pertinent past medical history.  There are no active problems to display for this patient.   History reviewed. No pertinent surgical history.  Prior to Admission medications   Medication Sig Start Date End Date Taking? Authorizing Provider  cephALEXin (KEFLEX) 500 MG capsule Take 1 capsule (500 mg total) by mouth 2 (two) times daily. 08/12/16 08/22/16  Darci Current, MD  lidocaine (XYLOCAINE) 2 % solution Use as directed 10 mLs in the mouth or throat as needed for mouth pain. 07/07/16   Enid Derry, PA-C  oxyCODONE-acetaminophen (ROXICET) 5-325 MG tablet Take 1 tablet by mouth every 4 (four) hours as needed for severe pain. 08/12/16   Darci Current, MD  predniSONE (STERAPRED UNI-PAK 21 TAB) 10 MG (21) TBPK tablet Take 6 tablets on day 1, take 5 tablets on day 2, take 4 tablets on day 3, take 3 tablets on day 4, take 2 tablets on day 5, take 1 tablet on day 6 05/30/16   Enid Derry, PA-C     Allergies Patient has no known allergies.  No family history on file.  Social History Social History  Substance Use Topics  . Smoking status: Never Smoker  . Smokeless tobacco: Never Used  . Alcohol use Yes    Review of Systems  Constitutional: No fever/chills     Gastrointestinal:  No nausea, no vomiting.    Skin: Negative for rash.As  above     ____________________________________________   PHYSICAL EXAM:  VITAL SIGNS: ED Triage Vitals  Enc Vitals Group     BP 08/12/16 1146 129/82     Pulse Rate 08/12/16 1146 81     Resp 08/12/16 1146 17     Temp 08/12/16 1146 98.5 F (36.9 C)     Temp Source 08/12/16 1146 Oral     SpO2 08/12/16 1146 97 %     Weight 08/12/16 1130 (!) 142.9 kg (315 lb)     Height 08/12/16 1130 1.702 m (5\' 7" )     Head Circumference --      Peak Flow --      Pain Score 08/12/16 1130 7     Pain Loc --      Pain Edu? --      Excl. in GC? --     Constitutional: Alert and oriented. No acute distress. Pleasant and interactive   Cardiovascular: Normal rate, regular rhythm.  Respiratory: Normal respiratory effort.  No retractions. Genitourinary: deferred Musculoskeletal: No lower extremity tenderness nor edema.   Neurologic:  Normal speech and language.  Skin:  Skin is warm, dry. Patient with 1 cm small pointing draining abscess medial dorsal right hand, no significant surrounding swelling, numbness or any erythema.   ____________________________________________   LABS (all labs ordered are listed, but only abnormal results are displayed)  Labs Reviewed - No data to display ____________________________________________  EKG   ____________________________________________  RADIOLOGY  None ____________________________________________   PROCEDURES  Procedure(s) performed: No    Critical Care performed: No ____________________________________________   INITIAL IMPRESSION / ASSESSMENT AND PLAN / ED COURSE  Pertinent labs & imaging results that were available during my care of the patient were reviewed by me and considered in my medical decision making (see chart for details).  Patient well-appearing no acute distress. Draining abscess on her hand. Recommend continue antibiotics, warm compresses, return if worsening swelling,redness fevers  chills   ____________________________________________   FINAL CLINICAL IMPRESSION(S) / ED DIAGNOSES  Final diagnoses:  Abscess      NEW MEDICATIONS STARTED DURING THIS VISIT:  Discharge Medication List as of 08/12/2016 12:05 PM       Note:  This document was prepared using Dragon voice recognition software and may include unintentional dictation errors.    Jene EveryKinner, Ersilia Brawley, MD 08/12/16 [phone]

## 2016-08-12 NOTE — Telephone Encounter (Signed)
Staff could not find patient in lobby.  She says she is out in parking lot, but thought she did not need to be seen.  I told her that if her hand was draining and looked worse or different, she could be seen .  She says she will come back in.

## 2016-08-12 NOTE — ED Notes (Signed)
MD at bedside. 

## 2016-08-12 NOTE — ED Triage Notes (Signed)
Pt was seen here yesterday for wound to the right hand , states back today because the wound is draining..Marland Kitchen

## 2016-10-01 ENCOUNTER — Emergency Department
Admission: EM | Admit: 2016-10-01 | Discharge: 2016-10-01 | Disposition: A | Discharge status: Home / Self Care | Payer: Medicaid Other | Attending: Emergency Medicine | Admitting: Emergency Medicine

## 2016-10-01 ENCOUNTER — Emergency Department: Payer: Medicaid Other

## 2016-10-01 DIAGNOSIS — K219 Gastro-esophageal reflux disease without esophagitis: Secondary | ICD-10-CM | POA: Insufficient documentation

## 2016-10-01 DIAGNOSIS — K2100 Gastro-esophageal reflux disease with esophagitis, without bleeding: Secondary | ICD-10-CM

## 2016-10-01 DIAGNOSIS — R0789 Other chest pain: Secondary | ICD-10-CM | POA: Insufficient documentation

## 2016-10-01 DIAGNOSIS — R079 Chest pain, unspecified: Secondary | ICD-10-CM

## 2016-10-01 LAB — CBC
HEMATOCRIT: 37.8 % (ref 35.0–47.0)
Hemoglobin: 12.7 g/dL (ref 12.0–16.0)
MCH: 28 pg (ref 26.0–34.0)
MCHC: 33.5 g/dL (ref 32.0–36.0)
MCV: 83.4 fL (ref 80.0–100.0)
PLATELETS: 330 10*3/uL (ref 150–440)
RBC: 4.53 MIL/uL (ref 3.80–5.20)
RDW: 15.2 % — AB (ref 11.5–14.5)
WBC: 6.2 10*3/uL (ref 3.6–11.0)

## 2016-10-01 LAB — BASIC METABOLIC PANEL
Anion gap: 6 (ref 5–15)
BUN: 14 mg/dL (ref 6–20)
CHLORIDE: 105 mmol/L (ref 101–111)
CO2: 27 mmol/L (ref 22–32)
CREATININE: 0.89 mg/dL (ref 0.44–1.00)
Calcium: 9.1 mg/dL (ref 8.9–10.3)
GFR calc Af Amer: >60 mL/min (ref >60)
GFR calc non Af Amer: >60 mL/min (ref >60)
Glucose, Bld: 88 mg/dL (ref 65–99)
Potassium: 4.1 mmol/L (ref 3.5–5.1)
Sodium: 138 mmol/L (ref 135–145)

## 2016-10-01 LAB — TROPONIN I
Troponin I: <0.03 ng/mL (ref <0.03)
Troponin I: <0.03 ng/mL (ref <0.03)

## 2016-10-01 MED ORDER — ASPIRIN 81 MG PO CHEW
324.0000 mg | CHEWABLE_TABLET | Freq: Once | ORAL | Status: AC
  Administered 2016-10-01: 324 mg via ORAL
  Filled 2016-10-01: qty 4

## 2016-10-01 MED ORDER — GI COCKTAIL ~~LOC~~
30.0000 mL | Freq: Once | ORAL | Status: AC
  Administered 2016-10-01: 30 mL via ORAL
  Filled 2016-10-01: qty 30

## 2016-10-01 NOTE — ED Provider Notes (Signed)
[facility] Emergency Department Provider Note ____________________________________________   First MD Initiated Contact with Patient 10/01/16 1108     (approximate)  I have reviewed the triage vital signs and the nursing notes.  HISTORY  Chief Complaint Chest Pain  HPI Betty Frazier is a 36 y.o. female no significant past history  Reports that last evening she had a slight feeling of "indigestion" went to bed with it, this morning about 10 AM at work she began increasing feeling in her mid chest. Slightly more on the right. Nonradiating. No shortness of breath. No nausea or vomiting. No sweating. Reports a slight feeling of moderate discomfort which she describes as hard to describe slight pressure but also slight burning feeling mid chest. Denies any abdominal pain.  Is not pregnant.  History reviewed. No pertinent past medical history.  There are no active problems to display for this patient.   History reviewed. No pertinent surgical history.  Prior to Admission medications   Medication Sig Start Date End Date Taking? Authorizing Provider  lidocaine (XYLOCAINE) 2 % solution Use as directed 10 mLs in the mouth or throat as needed for mouth pain. Patient not taking: Reported on 10/01/2016 07/07/16   Enid DerryWagner, Ashley, PA-C  oxyCODONE-acetaminophen (ROXICET) 5-325 MG tablet Take 1 tablet by mouth every 4 (four) hours as needed for severe pain. Patient not taking: Reported on 10/01/2016 08/12/16   Darci CurrentBrown, [place] N, MD  predniSONE (STERAPRED UNI-PAK 21 TAB) 10 MG (21) TBPK tablet Take 6 tablets on day 1, take 5 tablets on day 2, take 4 tablets on day 3, take 3 tablets on day 4, take 2 tablets on day 5, take 1 tablet on day 6 Patient not taking: Reported on 10/01/2016 05/30/16   Enid DerryWagner, Ashley, PA-C    Allergies Tylenol [acetaminophen]  No family history on file.  Social History Social History  Substance Use Topics  . Smoking status: Never Smoker  .  Smokeless tobacco: Never Used  . Alcohol use Yes    Review of Systems Constitutional: No fever/chills Eyes: No visual changes. ENT: No sore throat. Cardiovascular: See history of present illness Respiratory: Denies shortness of breath. Gastrointestinal: No abdominal pain.  No nausea, no vomiting.  No diarrhea.  No constipation. Genitourinary: Negative for dysuria. Musculoskeletal: Negative for back pain. Skin: Negative for rash. Neurological: Negative for headaches, focal weakness or numbness. No leg swelling. No travel history. No history of blood clots. No recent surgeries or injuries. Does not smoke.  Reports no family history of heart disease and her mother affirms his    ____________________________________________   PHYSICAL EXAM:  VITAL SIGNS: ED Triage Vitals  Enc Vitals Group     BP 10/01/16 1108 (!) 147/92     Pulse Rate 10/01/16 1108 71     Resp 10/01/16 1108 18     Temp 10/01/16 1108 98.3 F (36.8 C)     Temp Source 10/01/16 1108 Oral     SpO2 10/01/16 1108 98 %     Weight 10/01/16 1104 (!) 315 lb (142.9 kg)     Height 10/01/16 1104 5\' 7"  (1.702 m)     Head Circumference --      Peak Flow --      Pain Score --      Pain Loc --      Pain Edu? --      Excl. in GC? --     Constitutional: Alert and oriented. Well appearing and in no acute distress. Eyes: Conjunctivae  are normal. Head: Atraumatic. Nose: No congestion/rhinnorhea. Mouth/Throat: Mucous membranes are moist. Neck: No stridor.   Cardiovascular: Normal rate, regular rhythm. Grossly normal heart sounds.  Good peripheral circulation. Respiratory: Normal respiratory effort.  No retractions. Lungs CTAB. Gastrointestinal: Soft and nontender. No distention.Overweight. Musculoskeletal: No lower extremity tenderness nor edema. No venous cords. No thigh tenderness. Neurologic:  Normal speech and language. No gross focal neurologic deficits are appreciated.  Skin:  Skin is warm, dry and intact. No rash  noted. Psychiatric: Mood and affect are normal. Speech and behavior are normal.  ____________________________________________   LABS (all labs ordered are listed, but only abnormal results are displayed)  Labs Reviewed  CBC - Abnormal; Notable for the following:       Result Value   RDW 15.2 (*)    All other components within normal limits  BASIC METABOLIC PANEL  TROPONIN I  TROPONIN I   ____________________________________________  EKG  ED ECG REPORT I, Vang Kraeger, the attending physician, personally viewed and interpreted this ECG.  Date: 10/01/2016 EKG Time: 1110 Rate: 80 Rhythm: normal sinus rhythm QRS Axis: normal Intervals: normal ST/T Wave abnormalities: normal Narrative Interpretation: unremarkable  ____________________________________________  RADIOLOGY  Dg Chest 2 View  Result Date: 10/01/2016 CLINICAL DATA:  Chest pain starting last night, persistent. EXAM: CHEST  2 VIEW COMPARISON:  Chest x-ray dated 09/19/2004. FINDINGS: The heart size and mediastinal contours are within normal limits. Both lungs are clear. The visualized skeletal structures are unremarkable. IMPRESSION: No active cardiopulmonary disease. No evidence of pneumonia or pulmonary edema. Electronically Signed   By: Bary RichardStan  Maynard M.D.   On: 10/01/2016 11:35    ____________________________________________   PROCEDURES  Procedure(s) performed: None  Procedures  Critical Care performed: No  ____________________________________________   INITIAL IMPRESSION / ASSESSMENT AND PLAN / ED COURSE  Pertinent labs & imaging results that were available during my care of the patient were reviewed by me and considered in my medical decision making (see chart for details).  Chest pain. Fairly atypical in nature. Normal EKG and chest x-ray. No risk factors for PE, dissection. No infectious symptoms. Symptomatology sounds like it could be driven by possible acid reflux or gastric source. No pain on  exam of the abdomen. Also slightly musculoskeletal she reports tenderness to palpation across the area that is sore in the chest.    Clinical Course as of Oct 01 1444  Sat Oct 01, 2016  1256 Resting comfortably. Patient reports her pain is gone and she feels much better after drinking the GI cocktail.  [MQ]    Clinical Course User Index [MQ] Sharyn CreamerQuale, Anthony Roland, MD       Pulmonary Embolism Rule-out Criteria (PERC rule)                        If YES to ANY of the following, the [facility] rule is not satisfied and cannot be used to rule out PE in this patient (consider d-dimer or imaging depending on pre-test probability).                      If NO to ALL of the following, AND the clinician's pre-test probability is <15%, the [facility] rule is satisfied and there is no need for further workup (including no need to obtain a d-dimer) as the post-test probability of pulmonary embolism is <2%.  Mnemonic is HAD CLOTS   H - hormone use (exogenous estrogen)      No. A - age > 50                                                 No. D - DVT/PE history                                      No.   C - coughing blood (hemoptysis)                 No. L - leg swelling, unilateral                             No. O - O2 Sat on Room Air < 95%                  No. T - tachycardia (HR ? 100)                         No. S - surgery or trauma, recent                      No.   Based on my evaluation of the patient, including application of this decision instrument, further testing to evaluate for pulmonary embolism is not indicated at this time. I have discussed this recommendation with the patient who states understanding and agreement with this plan.  ----------------------------------------- 11:59 AM on 10/01/2016 -----------------------------------------  HEART score low risk. Plan to observe the patient repeat second troponin. Given atypical symptoms likely discharge to home if repeat troponin is  negative.  ----------------------------------------- 12:20 PM on 10/01/2016 -----------------------------------------  OBSERVATION CARE: This patient is being placed under observation care for the following reasons: Chest pain with repeat testing to rule out ischemia   ----------------------------------------- 2:47 PM on 10/01/2016 -----------------------------------------   END OF OBSERVATION STATUS: After an appropriate period of observation, this patient is being discharged due to the following reason(s):  Remains asymptomatic. Reports feeling much improved. Second troponin is normal  Return precautions and treatment recommendations and follow-up discussed with the patient who is agreeable with the plan.   ____________________________________________   FINAL CLINICAL IMPRESSION(S) / ED DIAGNOSES  Final diagnoses:  Chest pain with low risk for cardiac etiology  Reflux esophagitis      NEW MEDICATIONS STARTED DURING THIS VISIT:  New Prescriptions   No medications on file     Note:  This document was prepared using Dragon voice recognition software and may include unintentional dictation errors.     Sharyn Creamer, MD 10/01/16 1447

## 2016-10-01 NOTE — ED Triage Notes (Signed)
Pt came to ED via pov c/o chest pain starting last night. Reports started back this morning, central chest, denies radiation to any area or sob. VS stable.

## 2016-10-01 NOTE — Discharge Instructions (Signed)

## 2016-10-01 NOTE — ED Notes (Signed)
Pt. Going home with family. 

## 2016-11-26 ENCOUNTER — Emergency Department: Payer: Self-pay

## 2016-11-26 ENCOUNTER — Encounter: Payer: Self-pay | Admitting: Emergency Medicine

## 2016-11-26 ENCOUNTER — Emergency Department
Admission: EM | Admit: 2016-11-26 | Discharge: 2016-11-26 | Disposition: A | Discharge status: Home / Self Care | Payer: Self-pay | Attending: Emergency Medicine | Admitting: Emergency Medicine

## 2016-11-26 DIAGNOSIS — M79671 Pain in right foot: Secondary | ICD-10-CM | POA: Insufficient documentation

## 2016-11-26 LAB — URIC ACID: URIC ACID, SERUM: 6.2 mg/dL (ref 2.3–6.6)

## 2016-11-26 MED ORDER — NAPROXEN 500 MG PO TABS
500.0000 mg | ORAL_TABLET | Freq: Once | ORAL | Status: AC
  Administered 2016-11-26: 500 mg via ORAL
  Filled 2016-11-26: qty 1

## 2016-11-26 MED ORDER — NAPROXEN 500 MG PO TABS: 500.0000 mg | ORAL_TABLET | Freq: Two times a day (BID) | ORAL | 0 refills | Status: AC

## 2016-11-26 NOTE — ED Notes (Signed)

## 2016-11-26 NOTE — ED Provider Notes (Signed)
[facility] Emergency Department Provider Note ____________________________________________  Time seen: 2045  I have reviewed the triage vital signs and the nursing notes.  HISTORY  Chief Complaint  Foot Pain  HPI Betty Frazier is a 36 y.o. female presents to the ED for evaluation of a 2-3 week complaint of dorsal and lateral right foot pain. Patient denies any injury, trauma, or accident. She reports pain and swelling is improved somewhat from onset but she continues to have difficulty bearing weight to the foot. She was concerned for gout as her grandfather has history of the same. She denies any previouof foot or ankle swelling, pain, or redness. She presents now for further evaluation but she has not taken any medications in the interim for symptom relief.  History reviewed. No pertinent past medical history.  There are no active problems to display for this patient.  History reviewed. No pertinent surgical history.  Prior to Admission medications   Medication Sig Start Date End Date Taking? Authorizing Provider  lidocaine (XYLOCAINE) 2 % solution Use as directed 10 mLs in the mouth or throat as needed for mouth pain. Patient not taking: Reported on 10/01/2016 07/07/16   Enid Derry, PA-C  naproxen (NAPROSYN) 500 MG tablet Take 1 tablet (500 mg total) by mouth 2 (two) times daily with a meal. 11/26/16 12/26/16  Jentri Aye, Charlesetta Ivory, PA-C  oxyCODONE-acetaminophen (ROXICET) 5-325 MG tablet Take 1 tablet by mouth every 4 (four) hours as needed for severe pain. Patient not taking: Reported on 10/01/2016 08/12/16   Darci Current, MD  predniSONE (STERAPRED UNI-PAK 21 TAB) 10 MG (21) TBPK tablet Take 6 tablets on day 1, take 5 tablets on day 2, take 4 tablets on day 3, take 3 tablets on day 4, take 2 tablets on day 5, take 1 tablet on day 6 Patient not taking: Reported on 10/01/2016 05/30/16   Enid Derry, PA-C    Allergies Tylenol [acetaminophen]  No  family history on file.  Social History Social History  Substance Use Topics  . Smoking status: Never Smoker  . Smokeless tobacco: Never Used  . Alcohol use Yes    Review of Systems  Constitutional: Negative for fever. Musculoskeletal: Negative for back pain. Right foot pain as above. Skin: Negative for rash. Neurological: Negative for headaches, focal weakness or numbness. ____________________________________________  PHYSICAL EXAM:  VITAL SIGNS: ED Triage Vitals  Enc Vitals Group     BP 11/26/16 1818 139/65     Pulse Rate 11/26/16 1818 75     Resp 11/26/16 1818 18     Temp 11/26/16 1818 98.8 F (37.1 C)     Temp Source 11/26/16 1818 Oral     SpO2 11/26/16 1818 97 %     Weight 11/26/16 1820 295 lb (133.8 kg)     Height 11/26/16 1820  (1.702 m)     Head Circumference --      Peak Flow --      Pain Score 11/26/16 1818 8     Pain Loc --      Pain Edu? --      Excl. in GC? --     Constitutional: Alert and oriented. Well appearing and in no distress. Head: Normocephalic and atraumatic. Cardiovascular: Normal distal pulses. Respiratory: Normal respiratory effort.  Musculoskeletal: Right without any obvious deformity, dislocation, warmth, or edema. Patient is noted to palpation over the dorsolateral aspect of the foot. Or Achilles tenderness is appreciated. Ankle exam is otherwise benign. Nontender with normal  range of motion in all extremities.  Neurologic:  Normal gait without ataxia. Normal speech and language. No gross focal neurologic deficits are appreciated. Skin:  Skin is warm, dry and intact. No rash noted. ____________________________________________   LABS (pertinent positives/negatives)  Labs Reviewed  URIC ACID  ___________________________________________   RADIOLOGY  Right Foot  IMPRESSION: Negative.  I, Georgian Mcclory, Charlesetta Ivory, personally viewed and evaluated these images (plain radiographs) as part of my medical decision making, as well as  reviewing the written report by the radiologist. ____________________________________________  PROCEDURES  Post Op Shoe Naproxen 500 mg PO ____________________________________________  INITIAL IMPRESSION / ASSESSMENT AND PLAN / ED COURSE  Patient with ED evaluation of intermittent right foot pain and disability without known injury, trauma, or accident. She reports dorsolateral foot pain over the last due to 3 weeks. Her uric acid level is within normal range at this time and her x-rays negative for any acute fracture or dislocation. She is placed in a postop shoe for comfort and given a prescription for naproxen. She is referred to podiatry for ongoing symptom management. ____________________________________________  FINAL CLINICAL IMPRESSION(S) / ED DIAGNOSES  Final diagnoses:  Foot pain, right      Nike Southers, Charlesetta Ivory, PA-C 11/26/16 2327    Arnaldo Natal, MD 11/26/16 [phone]

## 2016-11-26 NOTE — ED Triage Notes (Signed)
R foot pain x 2 weeks, no injury at onset

## 2016-11-26 NOTE — Discharge Instructions (Signed)
Your exam, labs and x-ray are essentially normal at this time. Take the prescription med as directed. Apply ice or try moist foot soaks to reduce symptoms. Follow-up with podiatry for continued symptoms.

## 2017-06-04 ENCOUNTER — Emergency Department
Admission: EM | Admit: 2017-06-04 | Discharge: 2017-06-04 | Disposition: A | Discharge status: Home / Self Care | Payer: Self-pay | Attending: Emergency Medicine | Admitting: Emergency Medicine

## 2017-06-04 ENCOUNTER — Encounter: Payer: Self-pay | Admitting: Emergency Medicine

## 2017-06-04 ENCOUNTER — Other Ambulatory Visit: Payer: Self-pay

## 2017-06-04 ENCOUNTER — Emergency Department: Payer: Self-pay

## 2017-06-04 DIAGNOSIS — J40 Bronchitis, not specified as acute or chronic: Secondary | ICD-10-CM | POA: Insufficient documentation

## 2017-06-04 DIAGNOSIS — R0981 Nasal congestion: Secondary | ICD-10-CM | POA: Insufficient documentation

## 2017-06-04 DIAGNOSIS — J029 Acute pharyngitis, unspecified: Secondary | ICD-10-CM | POA: Insufficient documentation

## 2017-06-04 DIAGNOSIS — J040 Acute laryngitis: Secondary | ICD-10-CM | POA: Insufficient documentation

## 2017-06-04 DIAGNOSIS — J3489 Other specified disorders of nose and nasal sinuses: Secondary | ICD-10-CM | POA: Insufficient documentation

## 2017-06-04 MED ORDER — BENZONATATE 100 MG PO CAPS: 100.0000 mg | ORAL_CAPSULE | Freq: Three times a day (TID) | ORAL | 0 refills | Status: AC | PRN

## 2017-06-04 MED ORDER — PREDNISONE 50 MG PO TABS: ORAL_TABLET | ORAL | 0 refills | Status: DC

## 2017-06-04 NOTE — ED Provider Notes (Signed)
[facility] Emergency Department Provider Note  ____________________________________________  Time seen: Approximately 5:23 PM  I have reviewed the triage vital signs and the nursing notes.   HISTORY  Chief Complaint Cough and Sore Throat    HPI Betty Frazier is a 37 y.o. female presents to the emergency department with nonproductive cough, pharyngitis and laryngitis for approximately 1 week.  Patient reports that she is a Conservation officer, naturecashier at [employer] and was unable to go to work today due to symptoms.  She is tolerating fluids by mouth and her own secretions.  She denies shortness of breath, cough productive for purulent sputum production and fever.  No alleviating measures have been attempted.   History reviewed. No pertinent past medical history.  There are no active problems to display for this patient.   History reviewed. No pertinent surgical history.  Prior to Admission medications   Medication Sig Start Date End Date Taking? Authorizing Provider  lidocaine (XYLOCAINE) 2 % solution Use as directed 10 mLs in the mouth or throat as needed for mouth pain. Patient not taking: Reported on 10/01/2016 07/07/16   Enid DerryWagner, Ashley, PA-C  oxyCODONE-acetaminophen (ROXICET) 5-325 MG tablet Take 1 tablet by mouth every 4 (four) hours as needed for severe pain. Patient not taking: Reported on 10/01/2016 08/12/16   Darci CurrentBrown, Dover N, MD  predniSONE (DELTASONE) 50 MG tablet Take one 50 mg tablet once daily for the next 5 days. 06/04/17   Orvil FeilWoods, Jaclyn M, PA-C    Allergies Tylenol [acetaminophen]  No family history on file.  Social History Social History   Tobacco Use  . Smoking status: Never Smoker  . Smokeless tobacco: Never Used  Substance Use Topics  . Alcohol use: Yes  . Drug use: No      Review of Systems  Constitutional: No fever Eyes: No visual changes. No discharge ENT: Patient has congestion.  Cardiovascular: no chest pain. Respiratory: Patient has  cough.  Gastrointestinal: No abdominal pain.  No nausea, no vomiting. Patient had diarrhea.  Genitourinary: Negative for dysuria. No hematuria Musculoskeletal: Patient has myalgias.  Skin: Negative for rash, abrasions, lacerations, ecchymosis. Neurological: No focal weakness or numbness.   ____________________________________________   PHYSICAL EXAM:  VITAL SIGNS: ED Triage Vitals  Enc Vitals Group     BP 06/04/17 1546 (!) 157/106     Pulse Rate 06/04/17 1546 94     Resp 06/04/17 1546 16     Temp 06/04/17 1546 98.3 F (36.8 C)     Temp Source 06/04/17 1546 Oral     SpO2 06/04/17 1546 95 %     Weight 06/04/17 1543 (!) 305 lb (138.3 kg)     Height 06/04/17 1543 5\' 6"  (1.676 m)     Head Circumference --      Peak Flow --      Pain Score 06/04/17 1543 8     Pain Loc --      Pain Edu? --      Excl. in GC? --       Constitutional: Alert and oriented. Patient is lying supine. Eyes: Conjunctivae are normal. PERRL. EOMI. Head: Atraumatic. ENT:      Ears: Tympanic membranes are mildly injected with mild effusion bilaterally.       Nose: No congestion/rhinnorhea.      Mouth/Throat: Mucous membranes are moist. Posterior pharynx is mildly erythematous.  Hematological/Lymphatic/Immunilogical: No cervical lymphadenopathy.  Cardiovascular: Normal rate, regular rhythm. Normal S1 and S2.  Good peripheral circulation. Respiratory: Normal respiratory effort without  tachypnea or retractions. Lungs CTAB. Good air entry to the bases with no decreased or absent breath sounds. Gastrointestinal: Bowel sounds 4 quadrants. Soft and nontender to palpation. No guarding or rigidity. No palpable masses. No distention. No CVA tenderness. Musculoskeletal: Full range of motion to all extremities. No gross deformities appreciated. Neurologic:  Normal speech and language. No gross focal neurologic deficits are appreciated.  Skin:  Skin is warm, dry and intact. No rash noted. Psychiatric: Mood and affect  are normal. Speech and behavior are normal. Patient exhibits appropriate insight and judgement.      ____________________________________________   LABS (all labs ordered are listed, but only abnormal results are displayed)  Labs Reviewed - No data to display ____________________________________________  EKG   ____________________________________________  RADIOLOGY Geraldo Pitter, personally viewed and evaluated these images (plain radiographs) as part of my medical decision making, as well as reviewing the written report by the radiologist.  Dg Chest 2 View  Result Date: 06/04/2017 CLINICAL DATA:  Non smoker, Pt wearing mask, c/o sore throat x 2 weeks, pt barely able to speak. Pt states she has cough that is mostly non-productive EXAM: CHEST - 2 VIEW COMPARISON:  10/01/2016 FINDINGS: Heart, mediastinum and hila are unremarkable. Lungs are clear.  No pleural effusion or pneumothorax. Skeletal structures are unremarkable. IMPRESSION: Normal chest radiographs. Electronically Signed   By: Amie Portland M.D.   On: 06/04/2017 16:34    ____________________________________________    PROCEDURES  Procedure(s) performed:    Procedures    Medications - No data to display   ____________________________________________   INITIAL IMPRESSION / ASSESSMENT AND PLAN / ED COURSE  Pertinent labs & imaging results that were available during my care of the patient were reviewed by me and considered in my medical decision making (see chart for details).  Review of the [place] CSRS was performed in accordance of the [facility] prior to dispensing any controlled drugs.     Assessment and plan Upper respiratory tract infection Laryngitis Patient presents to the emergency department with cough, rhinorrhea, congestion, pharyngitis and laryngitis for approximately 1 week.  Differential diagnosis included community-acquired pneumonia versus bronchitis. Patient was treated empirically with  prednisone consolidations or findings with pneumonia were identified on chest x-ray and advised to follow-up with primary care as needed.  All patient questions were answered.    ____________________________________________  FINAL CLINICAL IMPRESSION(S) / ED DIAGNOSES  Final diagnoses:  Laryngitis  Bronchitis      NEW MEDICATIONS STARTED DURING THIS VISIT:  ED Discharge Orders        Ordered    predniSONE (DELTASONE) 50 MG tablet     06/04/17 1709          This chart was dictated using voice recognition software/Dragon. Despite best efforts to proofread, errors can occur which can change the meaning. Any change was purely unintentional.    Orvil Feil, PA-C 06/04/17 1728    Don Perking, [place], MD 06/04/17 2124

## 2017-06-04 NOTE — ED Triage Notes (Signed)
Cough and sore throat x 1 week.  States symptoms started about one week ago, started taking Mucinex, but symptoms not resolving.  C/O chest congestion, but congestion in "not breaking up".  AAOx3.  Skin warm and dry. NAD

## 2017-06-04 NOTE — ED Notes (Signed)
Pt wearing mask, c/o sore throat x 2 weeks, pt barely able to speak.  Pt states she has cough that is mostly non-productive. Pt lying on stretcher playing with phone, denies any hx of asthma.

## 2017-12-12 ENCOUNTER — Other Ambulatory Visit: Payer: Self-pay

## 2017-12-12 ENCOUNTER — Emergency Department
Admission: EM | Admit: 2017-12-12 | Discharge: 2017-12-12 | Disposition: A | Discharge status: Home / Self Care | Payer: Medicaid Other | Attending: Student in an Organized Health Care Education/Training Program | Admitting: Student in an Organized Health Care Education/Training Program

## 2017-12-12 ENCOUNTER — Emergency Department: Payer: Medicaid Other

## 2017-12-12 DIAGNOSIS — M5432 Sciatica, left side: Secondary | ICD-10-CM

## 2017-12-12 DIAGNOSIS — Z79899 Other long term (current) drug therapy: Secondary | ICD-10-CM | POA: Insufficient documentation

## 2017-12-12 LAB — POCT PREGNANCY, URINE: PREG TEST UR: NEGATIVE

## 2017-12-12 MED ORDER — CYCLOBENZAPRINE HCL 5 MG PO TABS: 5.0000 mg | ORAL_TABLET | Freq: Every day | ORAL | 0 refills | Status: DC

## 2017-12-12 MED ORDER — KETOROLAC TROMETHAMINE 10 MG PO TABS: 10.0000 mg | ORAL_TABLET | Freq: Three times a day (TID) | ORAL | 0 refills | Status: DC

## 2017-12-12 MED ORDER — ORPHENADRINE CITRATE 30 MG/ML IJ SOLN
INTRAMUSCULAR | Status: AC
  Filled 2017-12-12: qty 2

## 2017-12-12 MED ORDER — KETOROLAC TROMETHAMINE 30 MG/ML IJ SOLN
30.0000 mg | Freq: Once | INTRAMUSCULAR | Status: AC
  Administered 2017-12-12: 30 mg via INTRAMUSCULAR
  Filled 2017-12-12: qty 1

## 2017-12-12 MED ORDER — KETOROLAC TROMETHAMINE 30 MG/ML IJ SOLN
INTRAMUSCULAR | Status: AC
  Filled 2017-12-12: qty 1

## 2017-12-12 MED ORDER — GABAPENTIN 600 MG PO TABS
300.0000 mg | ORAL_TABLET | Freq: Once | ORAL | Status: AC
  Administered 2017-12-12: 300 mg via ORAL
  Filled 2017-12-12: qty 1

## 2017-12-12 MED ORDER — MELOXICAM 15 MG PO TABS: 15.0000 mg | ORAL_TABLET | Freq: Every day | ORAL | 0 refills | Status: AC

## 2017-12-12 MED ORDER — ORPHENADRINE CITRATE 30 MG/ML IJ SOLN
60.0000 mg | INTRAMUSCULAR | Status: AC
  Administered 2017-12-12: 60 mg via INTRAMUSCULAR
  Filled 2017-12-12: qty 2

## 2017-12-12 MED ORDER — GABAPENTIN 300 MG PO CAPS: 300.0000 mg | ORAL_CAPSULE | Freq: Three times a day (TID) | ORAL | 0 refills | Status: DC

## 2017-12-12 NOTE — ED Triage Notes (Signed)
Pt c/o L leg pain x months. Hasn't been seen d/t not wanting to be out of work. A&O x 4, ambulatory. No falls.

## 2017-12-12 NOTE — ED Provider Notes (Signed)
[facility] Emergency Department Provider Note ____________________________________________  Time seen: 2015  I have reviewed the triage vital signs and the nursing notes.  HISTORY  Chief Complaint  Leg Pain  HPI Betty Frazier is a 37 y.o. female who presents to the ED for evaluation of several months complaint of left lower extremity pain and paresthesias.  Patient denies any preceding injury, accident, or trauma.  She describe symptoms of been persistent for at least 3 months.  She been resistant to come in for evaluation, concerned that she might be forced to be at work, as she is a single mother of a teenager.  She denies any bladder or bowel incontinence, saddle anesthesias, or footdrop.  She describes posterior left leg pain and paresthesias like sharp burning pain from the buttocks to the foot.  She reports pain aggravated by prolonged standing and walking.  She also describes symptoms that are worsened with prolonged sitting.  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  She has been using topical muscle rub with limited benefit.  She has not seen her primary provider for these particular symptoms.  She denies any history of gout, arthritis, sciatica, chronic low back pain, or blood clots.  History reviewed. No pertinent past medical history.  There are no active problems to display for this patient.  History reviewed. No pertinent surgical history.  Prior to Admission medications   Medication Sig Start Date End Date Taking? Authorizing Provider  cyclobenzaprine (FLEXERIL) 5 MG tablet Take 1 tablet (5 mg total) by mouth at bedtime. 12/12/17   Romulus Hanrahan, Charlesetta Ivory, PA-C  gabapentin (NEURONTIN) 300 MG capsule Take 1 capsule (300 mg total) by mouth 3 (three) times daily. Increase dose as discussed to TID, as tolerated 12/12/17 01/11/18  Athira Janowicz, Charlesetta Ivory, PA-C  ketorolac (TORADOL) 10 MG tablet Take 1 tablet (10 mg total) by mouth  every 8 (eight) hours. 12/12/17   Quandarius Nill, Charlesetta Ivory, PA-C  lidocaine (XYLOCAINE) 2 % solution Use as directed 10 mLs in the mouth or throat as needed for mouth pain. Patient not taking: Reported on 10/01/2016 07/07/16   Enid Derry, PA-C  meloxicam (MOBIC) 15 MG tablet Take 1 tablet (15 mg total) by mouth daily. 12/12/17 01/11/18  Jennamarie Goings, Charlesetta Ivory, PA-C  oxyCODONE-acetaminophen (ROXICET) 5-325 MG tablet Take 1 tablet by mouth every 4 (four) hours as needed for severe pain. Patient not taking: Reported on 10/01/2016 08/12/16   Darci Current, MD  predniSONE (DELTASONE) 50 MG tablet Take one 50 mg tablet once daily for the next 5 days. 06/04/17   Orvil Feil, PA-C    Allergies Tylenol [acetaminophen]  History reviewed. No pertinent family history.  Social History Social History   Tobacco Use  . Smoking status: Never Smoker  . Smokeless tobacco: Never Used  Substance Use Topics  . Alcohol use: Yes  . Drug use: No    Review of Systems  Constitutional: Negative for fever. Cardiovascular: Negative for chest pain. Respiratory: Negative for shortness of breath. Gastrointestinal: Negative for abdominal pain, vomiting and diarrhea. Genitourinary: Negative for dysuria. Musculoskeletal: Negative for back pain.  Left lower extremity paresthesias as above. Skin: Negative for rash. Neurological: Negative for headaches, focal weakness or numbness. ____________________________________________  PHYSICAL EXAM:  VITAL SIGNS: ED Triage Vitals  Enc Vitals Group     BP 12/12/17 1838 (!) 155/86     Pulse Rate 12/12/17 1838 75     Resp 12/12/17 1838 18  Temp 12/12/17 1838 98.6 F (37 C)     Temp Source 12/12/17 1838 Oral     SpO2 12/12/17 1838 99 %     Weight 12/12/17 1837 245 lb (111.1 kg)     Height 12/12/17 1837 5\' 7"  (1.702 m)     Head Circumference --      Peak Flow --      Pain Score 12/12/17 1837 7     Pain Loc --      Pain Edu? --      Excl. in GC? --      Constitutional: Alert and oriented. Well appearing and in no distress. Head: Normocephalic and atraumatic. Eyes: Conjunctivae are normal. Normal extraocular movements Cardiovascular: Normal rate, regular rhythm. Normal distal pulses and cap refill. No palpable cords to the calf. Respiratory: Normal respiratory effort. No wheezes/rales/rhonchi. Musculoskeletal: No spinal alignment without midline tenderness, spasm, deformity, or step-off.  Patient transitions from sit to stand without assistance.  Normal lumbar flexion extension range on exam.  Nontender with normal range of motion in all extremities.  Neurologic: Nerves II through XII grossly intact.  Normal LE DTRs bilaterally.  Normal toe dorsiflexion foot eversion on exam.  Negative seated straight leg raise bilaterally.  Normal gait without ataxia. Normal speech and language. No gross focal neurologic deficits are appreciated. Skin:  Skin is warm, dry and intact. No rash noted. No pallor, erythema, or cold sensation.  Psychiatric: Mood and affect are normal. Patient exhibits appropriate insight and judgment. ____________________________________________   LABS (pertinent positives/negatives)  Labs Reviewed  POCT PREGNANCY, URINE  POC URINE PREG, ED  ____________________________________________   RADIOLOGY  Lumbar Spine  IMPRESSION: Mild degenerative changes.  No acute osseous abnormality. ____________________________________________  PROCEDURES  Procedures Toradol 30 mg PO Norflex 60 mg PO Gabapentin 300 mg PO ____________________________________________  INITIAL IMPRESSION / ASSESSMENT AND PLAN / ED COURSE  Patient presents to the ED for evaluation of 3+ months of LLE pain and paresthesias. Her exam is concerning for sciatic nerve irritation. She otherwise has normal skin temp and color is normal, and no acute neuromuscular deficits are noted. She is discharged with prescriptions for Flexeril, Gabapentin, Toradol, and  Meloxicam.  ____________________________________________  FINAL CLINICAL IMPRESSION(S) / ED DIAGNOSES  Final diagnoses:  Sciatica of left side      Lemont Sitzmann, Charlesetta Ivory, PA-C 12/12/17 2302    Willy Eddy, MD 12/12/17 2313

## 2017-12-12 NOTE — Discharge Instructions (Signed)
Your exam and x-rays are consistent with sciatic nerve irritation. Take the prescription meds as directed. Follow-up with your provider for ongoing symptoms.

## 2018-11-12 ENCOUNTER — Other Ambulatory Visit: Payer: Self-pay

## 2018-11-12 ENCOUNTER — Emergency Department: Payer: Self-pay

## 2018-11-12 ENCOUNTER — Emergency Department
Admission: EM | Admit: 2018-11-12 | Discharge: 2018-11-12 | Disposition: A | Discharge status: Home / Self Care | Payer: Self-pay | Attending: Emergency Medicine | Admitting: Emergency Medicine

## 2018-11-12 ENCOUNTER — Encounter: Payer: Self-pay | Admitting: Emergency Medicine

## 2018-11-12 DIAGNOSIS — J020 Streptococcal pharyngitis: Secondary | ICD-10-CM | POA: Insufficient documentation

## 2018-11-12 DIAGNOSIS — R059 Cough, unspecified: Secondary | ICD-10-CM

## 2018-11-12 DIAGNOSIS — R05 Cough: Secondary | ICD-10-CM | POA: Insufficient documentation

## 2018-11-12 LAB — GROUP A STREP BY PCR: Group A Strep by PCR: DETECTED — AB

## 2018-11-12 MED ORDER — AMOXICILLIN 500 MG PO CAPS: 500.0000 mg | ORAL_CAPSULE | Freq: Three times a day (TID) | ORAL | 0 refills | Status: DC

## 2018-11-12 MED ORDER — PROMETHAZINE-DM 6.25-15 MG/5ML PO SYRP: 5.0000 mL | ORAL_SOLUTION | Freq: Four times a day (QID) | ORAL | 0 refills | Status: DC | PRN

## 2018-11-12 NOTE — ED Triage Notes (Signed)
Here with sore throat, dry cough since Thursday, NAD. No resp. distress noted.

## 2018-11-12 NOTE — ED Notes (Signed)
See triage note  Presents with sore throat for couple of days  No fever

## 2018-11-12 NOTE — ED Provider Notes (Signed)
[facility] Emergency Department Provider Note   ____________________________________________   First MD Initiated Contact with Patient 11/12/18 [phone]     (approximate)  I have reviewed the triage vital signs and the nursing notes.   HISTORY  Chief Complaint Sore Throat and Cough    HPI Molley Ing is a 38 y.o. female patient complain of sore throat and productive cough for 4 days.  Patient state 2 days ago she had diarrhea which has resolved.  Patient denies nausea or vomiting.  Patient states she can tolerate fluids and soft foods with mild discomfort.  Patient denies recent travel or positive contact with COVID-19.         History reviewed. No pertinent past medical history.  There are no active problems to display for this patient.   History reviewed. No pertinent surgical history.  Prior to Admission medications   Medication Sig Start Date End Date Taking? Authorizing Provider  amoxicillin (AMOXIL) 500 MG capsule Take 1 capsule (500 mg total) by mouth 3 (three) times daily. 11/12/18   Sable Feil, PA-C  promethazine-dextromethorphan (PROMETHAZINE-DM) 6.25-15 MG/5ML syrup Take 5 mLs by mouth 4 (four) times daily as needed for cough. 11/12/18   Sable Feil, PA-C    Allergies Tylenol [acetaminophen]  No family history on file.  Social History Social History   Tobacco Use  . Smoking status: Never Smoker  . Smokeless tobacco: Never Used  Substance Use Topics  . Alcohol use: Yes  . Drug use: No    Review of Systems Constitutional: No fever/chills Eyes: No visual changes. ENT: No sore throat.  Sore throat. Cardiovascular: Denies chest pain. Respiratory: Denies shortness of breath.  Productive cough. Gastrointestinal: No abdominal pain.  No nausea, no vomiting.  No diarrhea.  No constipation. Genitourinary: Negative for dysuria. Musculoskeletal: Negative for back pain. Skin: Negative for rash. Neurological: Negative for  headaches, focal weakness or numbness. Allergic/Immunilogical: Tylenol. ____________________________________________   PHYSICAL EXAM:  VITAL SIGNS: ED Triage Vitals  Enc Vitals Group     BP 11/12/18 0824 (!) 169/104     Pulse Rate 11/12/18 0824 95     Resp 11/12/18 0824 18     Temp 11/12/18 0823 98.7 F (37.1 C)     Temp Source 11/12/18 0823 Oral     SpO2 11/12/18 0824 100 %     Weight 11/12/18 0823 (!) 315 lb (142.9 kg)     Height 11/12/18 0823 5\' 6"  (1.676 m)     Head Circumference --      Peak Flow --      Pain Score 11/12/18 0823 7     Pain Loc --      Pain Edu? --      Excl. in [place]? --     Constitutional: Alert and oriented. Well appearing and in no acute distress.  Morbid obesity. Eyes: Conjunctivae are normal. PERRL. EOMI. Head: Atraumatic. Nose: No congestion/rhinnorhea. Mouth/Throat: Mucous membranes are moist.  Oropharynx erythematous. Neck: No stridor.   Hematological/Lymphatic/Immunilogical: No cervical lymphadenopathy. Cardiovascular: Normal rate, regular rhythm. Grossly normal heart sounds.  Good peripheral circulation.  Evaded blood pressure. Respiratory: Normal respiratory effort.  No retractions. Lungs CTAB. Gastrointestinal: Soft and nontender. No distention. No abdominal bruits. No CVA tenderness. Genitourinary: Deferred Skin:  Skin is warm, dry and intact. No rash noted. Psychiatric: Mood and affect are normal. Speech and behavior are normal.  ____________________________________________   LABS (all labs ordered are listed, but only abnormal results are displayed)  Labs Reviewed  GROUP A STREP BY PCR - Abnormal; Notable for the following components:      Result Value   Group A Strep by PCR DETECTED (*)    All other components within normal limits   ____________________________________________  EKG   ____________________________________________  RADIOLOGY  ED MD interpretation:    Official radiology report(s): Dg Chest Portable 1 View   Result Date: 11/12/2018 CLINICAL DATA:  Cough and sore throat EXAM: PORTABLE CHEST 1 VIEW COMPARISON:  June 04, 2017 FINDINGS: Lungs are clear. Heart is upper normal in size with pulmonary vascularity normal. No adenopathy. No bone lesions. IMPRESSION: No edema or consolidation. Stable cardiac silhouette. No adenopathy evident. Electronically Signed   By: Bretta BangWilliam  Woodruff III M.D.   On: 11/12/2018 09:01    ____________________________________________   PROCEDURES  Procedure(s) performed (including Critical Care):  Procedures   ____________________________________________   INITIAL IMPRESSION / ASSESSMENT AND PLAN / ED COURSE  As part of my medical decision making, I reviewed the following data within the electronic MEDICAL RECORD NUMBER         Logann Guidry was evaluated in Emergency Department on 11/12/2018 for the symptoms described in the history of present illness. She was evaluated in the context of the global COVID-19 pandemic, which necessitated consideration that the patient might be at risk for infection with the SARS-CoV-2 virus that causes COVID-19. Institutional protocols and algorithms that pertain to the evaluation of patients at risk for COVID-19 are in a state of rapid change based on information released by regulatory bodies including the CDC and federal and state organizations. These policies and algorithms were followed during the patient's care in the ED.    Patient presents with 4 days of sore throat and cough.  Discussed positive strep results with patient.  Patient given a work note and discharge care instruction.  Patient advised take medication as directed.   ____________________________________________   FINAL CLINICAL IMPRESSION(S) / ED DIAGNOSES  Final diagnoses:  Strep pharyngitis  Cough     ED Discharge Orders         Ordered    amoxicillin (AMOXIL) 500 MG capsule  3 times daily     11/12/18 0957    promethazine-dextromethorphan  (PROMETHAZINE-DM) 6.25-15 MG/5ML syrup  4 times daily PRN     11/12/18 0957           Note:  This document was prepared using Dragon voice recognition software and may include unintentional dictation errors.    Joni ReiningSmith, Terrea Bruster K, PA-C 11/12/18 1001    Emily FilbertWilliams, Jonathan E, MD 11/12/18 1027

## 2019-06-09 ENCOUNTER — Emergency Department
Admission: EM | Admit: 2019-06-09 | Discharge: 2019-06-09 | Disposition: A | Discharge status: Home / Self Care | Payer: Medicaid Other | Attending: Student in an Organized Health Care Education/Training Program | Admitting: Student in an Organized Health Care Education/Training Program

## 2019-06-09 ENCOUNTER — Emergency Department: Payer: Medicaid Other

## 2019-06-09 ENCOUNTER — Other Ambulatory Visit: Payer: Self-pay

## 2019-06-09 DIAGNOSIS — Z79899 Other long term (current) drug therapy: Secondary | ICD-10-CM | POA: Insufficient documentation

## 2019-06-09 DIAGNOSIS — R0602 Shortness of breath: Secondary | ICD-10-CM | POA: Insufficient documentation

## 2019-06-09 DIAGNOSIS — Z20822 Contact with and (suspected) exposure to covid-19: Secondary | ICD-10-CM | POA: Insufficient documentation

## 2019-06-09 DIAGNOSIS — J40 Bronchitis, not specified as acute or chronic: Secondary | ICD-10-CM | POA: Insufficient documentation

## 2019-06-09 LAB — BASIC METABOLIC PANEL
Anion gap: 9 (ref 5–15)
BUN: 18 mg/dL (ref 6–20)
CO2: 26 mmol/L (ref 22–32)
Calcium: 8.9 mg/dL (ref 8.9–10.3)
Chloride: 103 mmol/L (ref 98–111)
Creatinine, Ser: 0.89 mg/dL (ref 0.44–1.00)
GFR calc Af Amer: >60 mL/min (ref >60)
GFR calc non Af Amer: >60 mL/min (ref >60)
Glucose, Bld: 93 mg/dL (ref 70–99)
Potassium: 3.4 mmol/L — ABNORMAL LOW (ref 3.5–5.1)
Sodium: 138 mmol/L (ref 135–145)

## 2019-06-09 LAB — CBC
HCT: 41 % (ref 36.0–46.0)
Hemoglobin: 13.5 g/dL (ref 12.0–15.0)
MCH: 28.8 pg (ref 26.0–34.0)
MCHC: 32.9 g/dL (ref 30.0–36.0)
MCV: 87.4 fL (ref 80.0–100.0)
Platelets: 302 10*3/uL (ref 150–400)
RBC: 4.69 MIL/uL (ref 3.87–5.11)
RDW: 14.3 % (ref 11.5–15.5)
WBC: 8 10*3/uL (ref 4.0–10.5)
nRBC: 0 % (ref 0.0–0.2)

## 2019-06-09 LAB — TROPONIN I (HIGH SENSITIVITY)
Troponin I (High Sensitivity): 5 ng/L (ref <18)
Troponin I (High Sensitivity): 4 ng/L (ref <18)

## 2019-06-09 LAB — SARS CORONAVIRUS 2 (TAT 6-24 HRS): SARS Coronavirus 2: NEGATIVE

## 2019-06-09 MED ORDER — IPRATROPIUM-ALBUTEROL 0.5-2.5 (3) MG/3ML IN SOLN
3.0000 mL | Freq: Once | RESPIRATORY_TRACT | Status: AC
  Administered 2019-06-09: 09:00:00 3 mL via RESPIRATORY_TRACT
  Filled 2019-06-09: qty 3

## 2019-06-09 MED ORDER — ALBUTEROL SULFATE HFA 108 (90 BASE) MCG/ACT IN AERS: 2.0000 | INHALATION_SPRAY | Freq: Four times a day (QID) | RESPIRATORY_TRACT | 0 refills | Status: DC | PRN

## 2019-06-09 MED ORDER — IPRATROPIUM-ALBUTEROL 0.5-2.5 (3) MG/3ML IN SOLN
3.0000 mL | Freq: Once | RESPIRATORY_TRACT | Status: AC
  Administered 2019-06-09: 11:00:00 3 mL via RESPIRATORY_TRACT
  Filled 2019-06-09: qty 3

## 2019-06-09 MED ORDER — METHYLPREDNISOLONE SODIUM SUCC 125 MG IJ SOLR
80.0000 mg | Freq: Once | INTRAMUSCULAR | Status: AC
  Administered 2019-06-09: 80 mg via INTRAVENOUS
  Filled 2019-06-09: qty 2

## 2019-06-09 MED ORDER — PREDNISONE 20 MG PO TABS: 40.0000 mg | ORAL_TABLET | Freq: Every day | ORAL | 0 refills | Status: AC

## 2019-06-09 NOTE — ED Triage Notes (Signed)
Pt c/o SOB since last night while at work with intermittent left sided chest discomfort. Pt has audible wheezing upon arrival. Denies cough or congestion

## 2019-06-09 NOTE — ED Provider Notes (Signed)
[facility] Emergency Department Provider Note    First MD Initiated Contact with Patient 06/09/19 [phone]     (approximate)  I have reviewed the triage vital signs and the nursing notes.   HISTORY  Chief Complaint Shortness of Breath    HPI Betty Frazier is a 39 y.o. female no significant past medical history presents to the ER for shortness of breath that has been progressively worsening overnight.  States that she can feel herself wheezing.  Start feeling generalized weakness.  Feeling tightness and pressure in her chest as well.  No measured fevers.  Did go to work but denies any new recently chemical exposures.  Denies any history of cardiac disease.  Does not recall any history of lung disease as a child.    History reviewed. No pertinent past medical history. No family history on file. History reviewed. No pertinent surgical history. There are no problems to display for this patient.     Prior to Admission medications   Medication Sig Start Date End Date Taking? Authorizing Provider  albuterol (VENTOLIN HFA) 108 (90 Base) MCG/ACT inhaler Inhale 2 puffs into the lungs every 6 (six) hours as needed for wheezing or shortness of breath. 06/09/19   Willy Eddy, MD  amoxicillin (AMOXIL) 500 MG capsule Take 1 capsule (500 mg total) by mouth 3 (three) times daily. 11/12/18   Joni Reining, PA-C  predniSONE (DELTASONE) 20 MG tablet Take 2 tablets (40 mg total) by mouth daily for 5 days. 06/09/19 06/14/19  Willy Eddy, MD  promethazine-dextromethorphan (PROMETHAZINE-DM) 6.25-15 MG/5ML syrup Take 5 mLs by mouth 4 (four) times daily as needed for cough. 11/12/18   Joni Reining, PA-C    Allergies Tylenol [acetaminophen]    Social History Social History   Tobacco Use  . Smoking status: Never Smoker  . Smokeless tobacco: Never Used  Substance Use Topics  . Alcohol use: Yes  . Drug use: No    Review of Systems Patient denies  headaches, rhinorrhea, blurry vision, numbness, shortness of breath, chest pain, edema, cough, abdominal pain, nausea, vomiting, diarrhea, dysuria, fevers, rashes or hallucinations unless otherwise stated above in HPI. ____________________________________________   PHYSICAL EXAM:  VITAL SIGNS: Vitals:   06/09/19 1205 06/09/19 1210  BP:    Pulse: (!) 105 76  Resp: (!) 28 18  Temp:    SpO2: 94% 95%    Constitutional: Alert and oriented.  Eyes: Conjunctivae are normal.  Head: Atraumatic. Nose: No congestion/rhinnorhea. Mouth/Throat: Mucous membranes are moist.   Neck: No stridor. Painless ROM.  Cardiovascular: Normal rate, regular rhythm. Grossly normal heart sounds.  Good peripheral circulation. Respiratory: Mild tachypnea with diminished breath sounds throughout with faint expiratory wheeze and prolonged expiratory phase  gastrointestinal: Soft and nontender. No distention. No abdominal bruits. No CVA tenderness. Genitourinary:  Musculoskeletal: No lower extremity tenderness nor edema.  No joint effusions. Neurologic:  Normal speech and language. No gross focal neurologic deficits are appreciated. No facial droop Skin:  Skin is warm, dry and intact. No rash noted. Psychiatric: Mood and affect are normal. Speech and behavior are normal.  ____________________________________________   LABS (all labs ordered are listed, but only abnormal results are displayed)  Results for orders placed or performed during the hospital encounter of 06/09/19 (from the past 24 hour(s))  Basic metabolic panel     Status: Abnormal   Collection Time: 06/09/19  7:31 AM  Result Value Ref Range   Sodium 138 135 - 145 mmol/L   Potassium  3.4 (L) 3.5 - 5.1 mmol/L   Chloride 103 98 - 111 mmol/L   CO2 26 22 - 32 mmol/L   Glucose, Bld 93 70 - 99 mg/dL   BUN 18 6 - 20 mg/dL   Creatinine, Ser 0.89 0.44 - 1.00 mg/dL   Calcium 8.9 8.9 - 10.3 mg/dL   GFR calc non Af Amer >60 >60 mL/min   GFR calc Af Amer  >60 >60 mL/min   Anion gap 9 5 - 15  CBC     Status: None   Collection Time: 06/09/19  7:31 AM  Result Value Ref Range   WBC 8.0 4.0 - 10.5 K/uL   RBC 4.69 3.87 - 5.11 MIL/uL   Hemoglobin 13.5 12.0 - 15.0 g/dL   HCT 41.0 36.0 - 46.0 %   MCV 87.4 80.0 - 100.0 fL   MCH 28.8 26.0 - 34.0 pg   MCHC 32.9 30.0 - 36.0 g/dL   RDW 14.3 11.5 - 15.5 %   Platelets 302 150 - 400 K/uL   nRBC 0.0 0.0 - 0.2 %  Troponin I (High Sensitivity)     Status: None   Collection Time: 06/09/19  7:31 AM  Result Value Ref Range   Troponin I (High Sensitivity) 5 <18 ng/L  Troponin I (High Sensitivity)     Status: None   Collection Time: 06/09/19  9:29 AM  Result Value Ref Range   Troponin I (High Sensitivity) 4 <18 ng/L   ____________________________________________  EKG My review and personal interpretation at Time: 7:32   Indication: sob  Rate: 95  Rhythm: sinus Axis: normal Other: normal intervals, no stemi ____________________________________________  RADIOLOGY  I personally reviewed all radiographic images ordered to evaluate for the above acute complaints and reviewed radiology reports and findings.  These findings were personally discussed with the patient.  Please see medical record for radiology report.  ____________________________________________   PROCEDURES  Procedure(s) performed:  Procedures    Critical Care performed: no ____________________________________________   INITIAL IMPRESSION / ASSESSMENT AND PLAN / ED COURSE  Pertinent labs & imaging results that were available during my care of the patient were reviewed by me and considered in my medical decision making (see chart for details).   DDX: Asthma, copd, CHF, pna, ptx, malignancy, Pe, anemia   Betty Frazier is a 39 y.o. who presents to the ED with symptoms as described above.  Patient nontoxic-appearing but does have mild tachypnea with diffuse wheezing consistent with bronchitis or asthma or some form reactive  airway disease.  Chest x-ray shows no evidence of infiltrate pneumothorax or edema.  Is not consistent with cardiac wheeze.  Blood work sent for above differential was reassuring.  Will give nebs steroids and reassess.  Clinical Course as of Jun 08 1228  Sun Jun 09, 2019  0946 Patient reassessed.  Significant improvement in respirations after bronchodilators.  Patient feels improved.  We will continue to observe.   [PR]  5035 Patient continues to feel significantly improved.  We will ambulate her to make sure she is not dropping her sats but her presentation is consistent with bronchitis.  Anticipate discharge home.   [PR]    Clinical Course User Index [PR] Merlyn Lot, MD    The patient was evaluated in Emergency Department today for the symptoms described in the history of present illness. He/she was evaluated in the context of the global COVID-19 pandemic, which necessitated consideration that the patient might be at risk for infection with the SARS-CoV-2 virus that  causes COVID-19. Institutional protocols and algorithms that pertain to the evaluation of patients at risk for COVID-19 are in a state of rapid change based on information released by regulatory bodies including the CDC and federal and state organizations. These policies and algorithms were followed during the patient's care in the ED.  As part of my medical decision making, I reviewed the following data within the electronic MEDICAL RECORD NUMBER Nursing notes reviewed and incorporated, Labs reviewed, notes from prior ED visits and [place] Controlled Substance Database   ____________________________________________   FINAL CLINICAL IMPRESSION(S) / ED DIAGNOSES  Final diagnoses:  Bronchitis  SOB (shortness of breath)      NEW MEDICATIONS STARTED DURING THIS VISIT:  New Prescriptions   ALBUTEROL (VENTOLIN HFA) 108 (90 BASE) MCG/ACT INHALER    Inhale 2 puffs into the lungs every 6 (six) hours as needed for wheezing or  shortness of breath.   PREDNISONE (DELTASONE) 20 MG TABLET    Take 2 tablets (40 mg total) by mouth daily for 5 days.     Note:  This document was prepared using Dragon voice recognition software and may include unintentional dictation errors.    Willy Eddy, MD 06/09/19 1230

## 2020-09-13 ENCOUNTER — Emergency Department: Payer: Medicaid Other

## 2020-09-13 ENCOUNTER — Emergency Department
Admission: EM | Admit: 2020-09-13 | Discharge: 2020-09-13 | Disposition: A | Discharge status: Home / Self Care | Payer: Medicaid Other | Attending: Emergency Medicine | Admitting: Emergency Medicine

## 2020-09-13 ENCOUNTER — Other Ambulatory Visit: Payer: Self-pay

## 2020-09-13 DIAGNOSIS — Z79899 Other long term (current) drug therapy: Secondary | ICD-10-CM | POA: Insufficient documentation

## 2020-09-13 DIAGNOSIS — M25561 Pain in right knee: Secondary | ICD-10-CM | POA: Insufficient documentation

## 2020-09-13 DIAGNOSIS — G8929 Other chronic pain: Secondary | ICD-10-CM

## 2020-09-13 MED ORDER — DICLOFENAC SODIUM 50 MG PO TBEC: 50.0000 mg | DELAYED_RELEASE_TABLET | Freq: Two times a day (BID) | ORAL | 0 refills | Status: AC

## 2020-09-13 MED ORDER — CYCLOBENZAPRINE HCL 5 MG PO TABS: 5.0000 mg | ORAL_TABLET | Freq: Every day | ORAL | 0 refills | Status: DC

## 2020-09-13 NOTE — ED Triage Notes (Signed)
Pt c/o right  knee pain for the past 2 months, denies injury,

## 2020-09-13 NOTE — Discharge Instructions (Addendum)
Take the prescription meds as directed.  Follow-up with your primary provider for ongoing symptoms.

## 2020-09-13 NOTE — ED Provider Notes (Signed)
[facility] Emergency Department Provider Note ____________________________________________  Time seen: 1159  I have reviewed the triage vital signs and the nursing notes.  HISTORY  Chief Complaint  Knee Pain  HPI Betty Frazier is a 39 y.o. female Presents to the ED for what she would describe a several years of right knee pain, worsening over the last 2 months.  Denies any preceding injury, trauma, or fall.  Patient works as a Conservation officer, nature, and stands and walks constantly.  She describes achy pain to the right knee for the last several months.  She denies any aggravating or alleviating factors.  She will denies taking any medicine in the interim.  She has not been evaluated by any of her prior providers for this complaint.  History reviewed. No pertinent past medical history.  There are no problems to display for this patient.   History reviewed. No pertinent surgical history.  Prior to Admission medications   Medication Sig Start Date End Date Taking? Authorizing Provider  diclofenac (VOLTAREN) 50 MG EC tablet Take 1 tablet (50 mg total) by mouth 2 (two) times daily. 09/13/20 10/13/20 Yes Lacretia Tindall, Charlesetta Ivory, PA-C  albuterol (VENTOLIN HFA) 108 (90 Base) MCG/ACT inhaler Inhale 2 puffs into the lungs every 6 (six) hours as needed for wheezing or shortness of breath. 06/09/19   Willy Eddy, MD  cyclobenzaprine (FLEXERIL) 5 MG tablet Take 1 tablet (5 mg total) by mouth at bedtime. 09/13/20  Yes Arelene Moroni, Charlesetta Ivory, PA-C    Allergies Tylenol [acetaminophen]  History reviewed. No pertinent family history.  Social History Social History   Tobacco Use   Smoking status: Never   Smokeless tobacco: Never  Substance Use Topics   Alcohol use: Yes   Drug use: No    Review of Systems  Constitutional: Negative for fever. Eyes: Negative for visual changes. ENT: Negative for sore throat. Respiratory: Negative for shortness of breath. Gastrointestinal:  Negative for abdominal pain, vomiting and diarrhea. Genitourinary: Negative for dysuria. Musculoskeletal: Negative for back pain. Right knee pain Skin: Negative for rash. Neurological: Negative for headaches, focal weakness or numbness. ____________________________________________  PHYSICAL EXAM:  VITAL SIGNS: ED Triage Vitals  Enc Vitals Group     BP 09/13/20 0907 (!) 146/94     Pulse Rate 09/13/20 0906 86     Resp 09/13/20 0906 16     Temp 09/13/20 0906 98.2 F (36.8 C)     Temp Source 09/13/20 0906 Oral     SpO2 09/13/20 0906 95 %     Weight 09/13/20 0903 (!) 320 lb (145.2 kg)     Height 09/13/20 0903 5\' 6"  (1.676 m)     Head Circumference --      Peak Flow --      Pain Score 09/13/20 0903 10     Pain Loc --      Pain Edu? --      Excl. in [place]? --     Constitutional: Alert and oriented. Well appearing and in no distress. Head: Normocephalic and atraumatic. Eyes: Conjunctivae are normal. Normal extraocular movements Cardiovascular: Normal rate, regular rhythm. Normal distal pulses. Respiratory: Normal respiratory effort.  Musculoskeletal: Right knee without obvious deformity, dislocation, or effusion.  Normal active range of motion to the knee without laxity, catch, click, or LOC.  No valgus or varus on stress is appreciated.  No popliteal space fullness is noted.  No significant calf or Achilles tenderness is elicited.  Negative anterior/posterior drawer sign.  Nontender with normal range  of motion in all extremities.  Neurologic: Normal speech and language. No gross focal neurologic deficits are appreciated. Skin:  Skin is warm, dry and intact. No rash noted. ____________________________________________   RADIOLOGY  DG Right Knee  IMPRESSION: Small joint effusion suspected but otherwise normal radiographic appearance of the right knee. ____________________________________________  PROCEDURES   Procedures ____________________________________________   INITIAL  IMPRESSION / ASSESSMENT AND PLAN / ED COURSE  As part of my medical decision making, I reviewed the following data within the electronic MEDICAL RECORD NUMBER Radiograph reviewed WNL and Notes from prior ED visits    DDX: OA, effusion, fracture, strain  Patient ED evaluation of what ends up being chronic knee pain intermittently over the last 2+ years.  Patient denies any preceding injury, trauma, fall.  She is evaluated for complaint in the ED, found to have a normal exam without signs of internal derangement.  No radiologic evidence of any acute fracture or dislocation.  Patient will be discharged on a prescription of daily anti-inflammatories to take as directed.  She will follow-up with her primary provider for ongoing symptoms.  Return precautions of been reviewed.   Betty Frazier was evaluated in Emergency Department on 09/13/2020 for the symptoms described in the history of present illness. She was evaluated in the context of the global COVID-19 pandemic, which necessitated consideration that the patient might be at risk for infection with the SARS-CoV-2 virus that causes COVID-19. Institutional protocols and algorithms that pertain to the evaluation of patients at risk for COVID-19 are in a state of rapid change based on information released by regulatory bodies including the CDC and federal and state organizations. These policies and algorithms were followed during the patient's care in the ED. ____________________________________________  FINAL CLINICAL IMPRESSION(S) / ED DIAGNOSES  Final diagnoses:  Chronic pain of right knee      Lissa Hoard, PA-C 09/13/20 1247    Arnaldo Natal, MD 09/13/20 1258

## 2021-08-13 ENCOUNTER — Ambulatory Visit: Payer: Medicaid Other

## 2022-01-31 IMAGING — CR DG KNEE COMPLETE 4+V*R*
4 series · 4 of 4 positions shown · non-contrast
Comparison: Right tib fib 03/15/2014.

CLINICAL DATA: 40-year-old female with right knee pain for 2
months. No known injury.

EXAM:
RIGHT KNEE - COMPLETE 4+ VIEW

[knee ap]
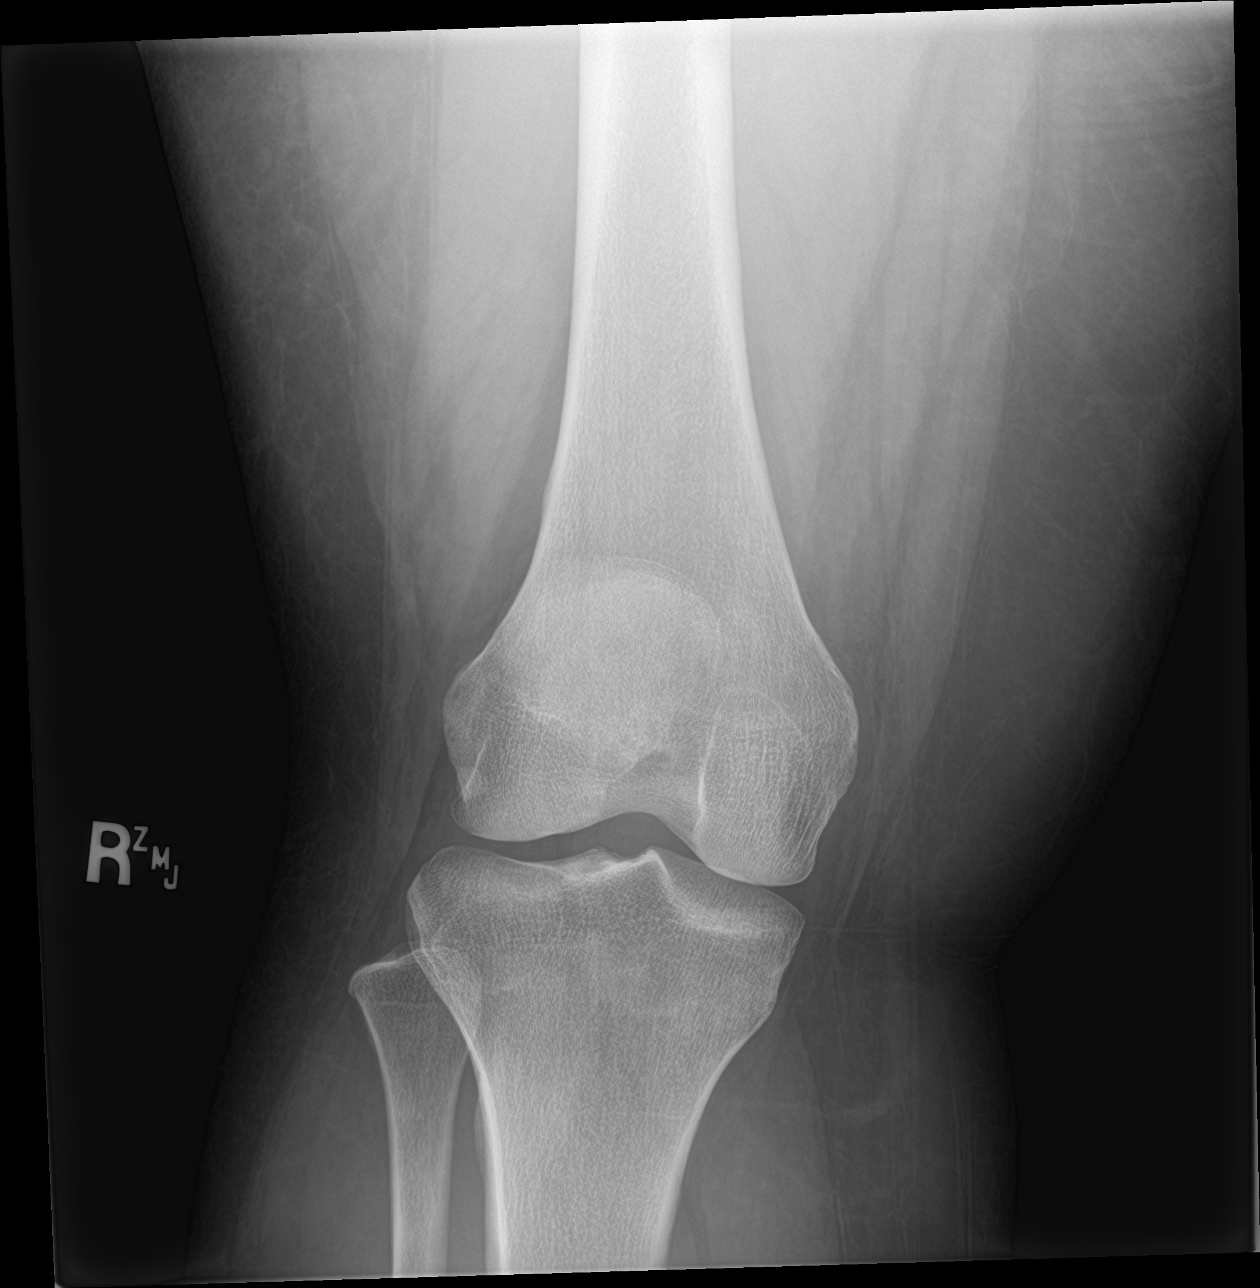

[knee obl (1 of 2)]
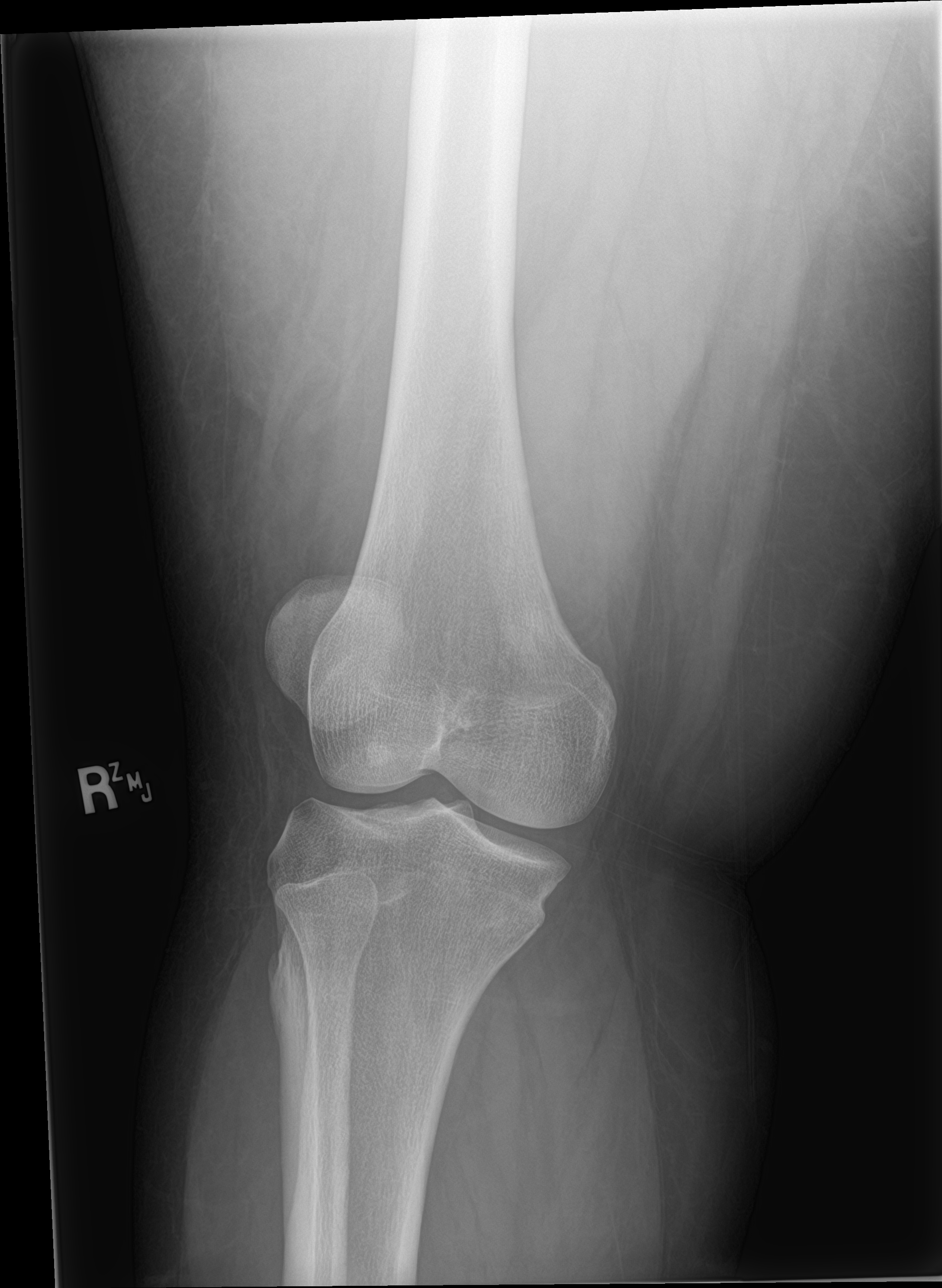

[knee obl (2 of 2)]
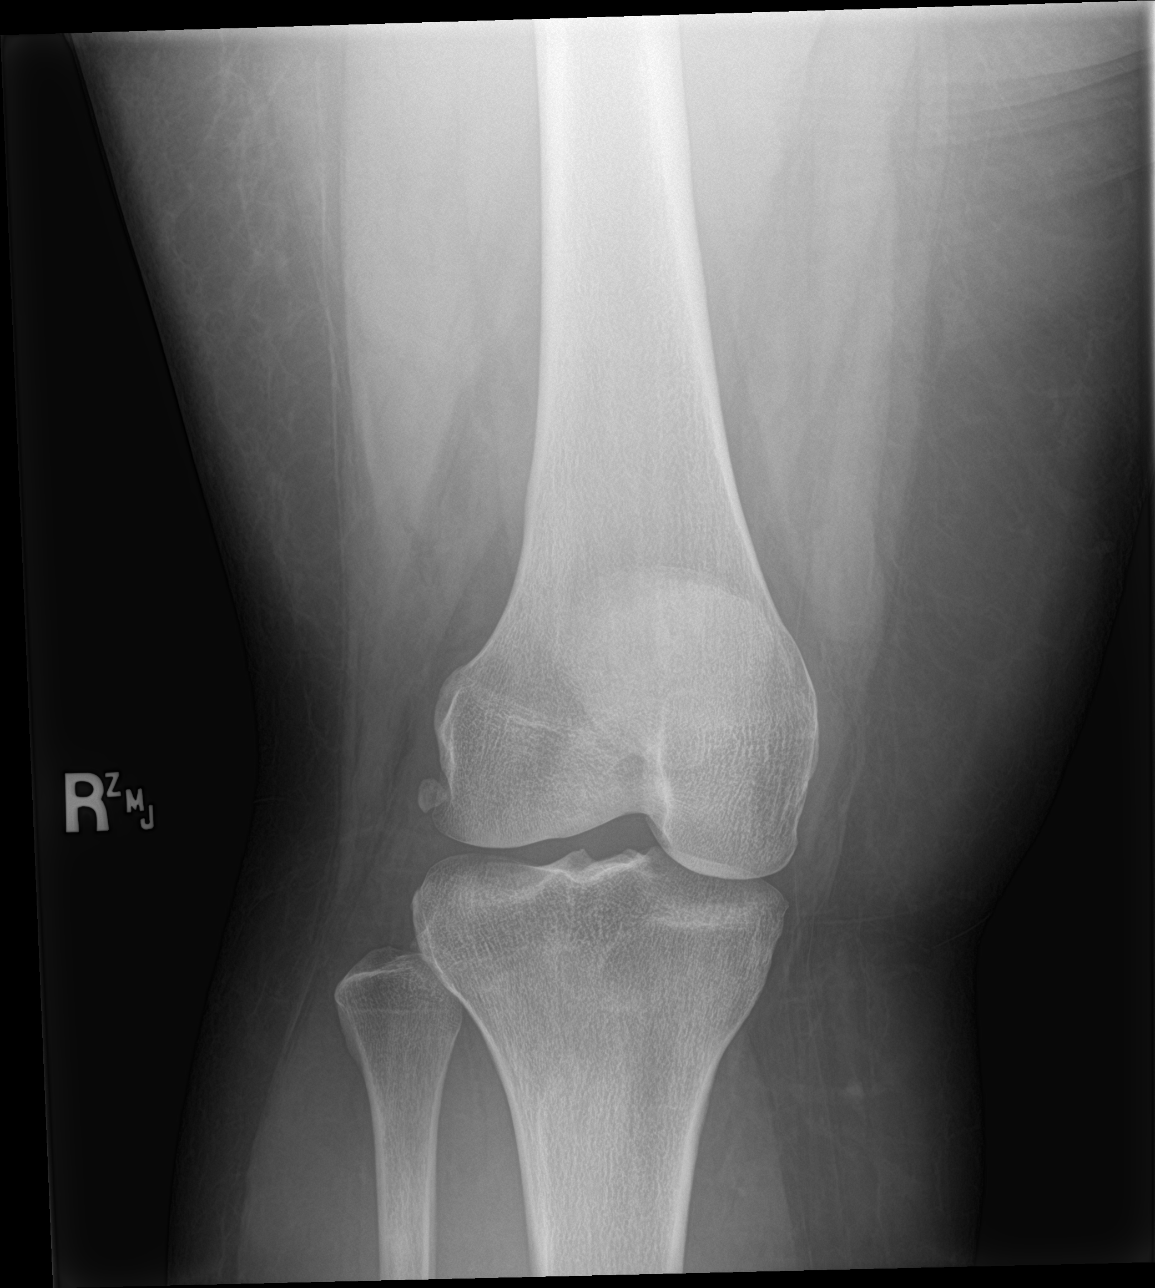

[knee lat]
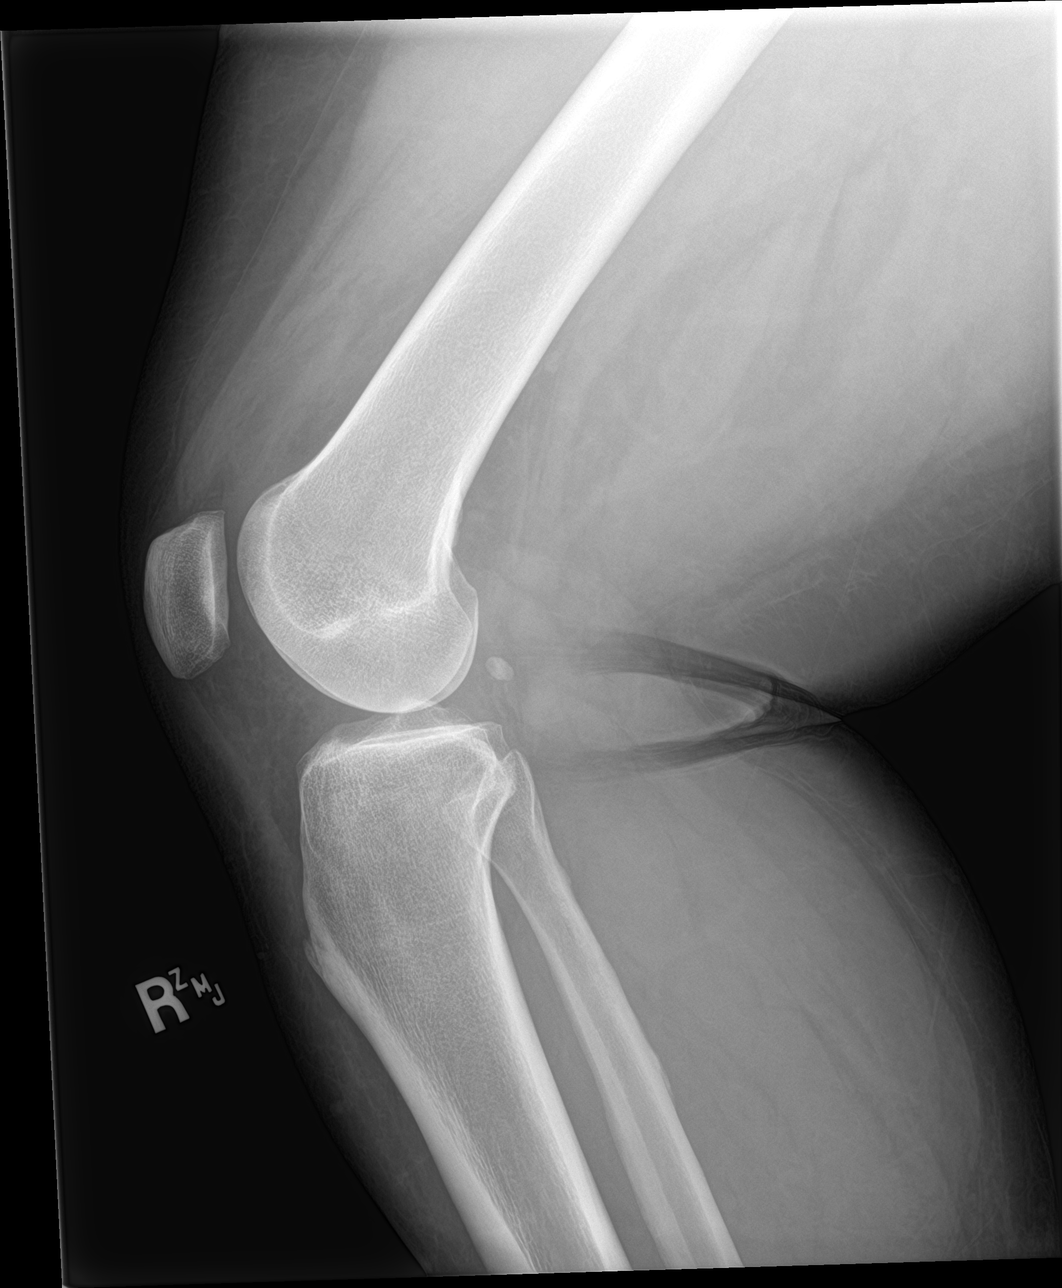

[4 of 4 positions shown; findings below may reference images not displayed]

FINDINGS: Bone mineralization is within normal limits. Possible small joint
effusion on the lateral view. But normal joint spaces and alignment.
Patella intact. Normal fabella. No osseous abnormality identified.
IMPRESSION: Small joint effusion suspected but otherwise normal radiographic
appearance of the right knee.

## 2022-06-02 ENCOUNTER — Encounter: Payer: Self-pay | Admitting: *Deleted

## 2022-06-02 ENCOUNTER — Emergency Department
Admission: EM | Admit: 2022-06-02 | Discharge: 2022-06-02 | Disposition: A | Discharge status: Home / Self Care | Payer: Medicaid Other | Attending: Emergency Medicine | Admitting: Emergency Medicine

## 2022-06-02 ENCOUNTER — Other Ambulatory Visit: Payer: Self-pay

## 2022-06-02 DIAGNOSIS — M25561 Pain in right knee: Secondary | ICD-10-CM | POA: Insufficient documentation

## 2022-06-02 DIAGNOSIS — G8929 Other chronic pain: Secondary | ICD-10-CM

## 2022-06-02 MED ORDER — OXYCODONE HCL 5 MG PO TABS
5.0000 mg | ORAL_TABLET | Freq: Once | ORAL | Status: DC
  Filled 2022-06-02: qty 1

## 2022-06-02 NOTE — ED Provider Notes (Signed)
[facility] Provider Note    Event Date/Time   First MD Initiated Contact with Patient 06/02/22 1747     (approximate)   History   Leg Pain   HPI  Betty Frazier is a 42 y.o. female who presents because of right knee pain.  Patient notes that for the last 2 years she has had intermittent pain in the right knee that radiates up into the thigh.  It is worse when she tries to bend the knee and ambulate.  Pain was worse today at work.  Denies preceding trauma.  No numbness or tingling in the foot or leg.  Feels like the leg is swollen.  No history of DVT/PE.  She is been taking some Motrin for it.  Still able to ambulate.    History reviewed. No pertinent past medical history.  There are no problems to display for this patient.    Physical Exam  Triage Vital Signs: ED Triage Vitals  Enc Vitals Group     BP 06/02/22 1707 (!) 143/93     Pulse Rate 06/02/22 1707 90     Resp 06/02/22 1707 20     Temp 06/02/22 1707 98.3 F (36.8 C)     Temp Source 06/02/22 1707 Oral     SpO2 06/02/22 1707 98 %     Weight 06/02/22 1704 300 lb (136.1 kg)     Height 06/02/22 1704 5\' 7"  (1.702 m)     Head Circumference --      Peak Flow --      Pain Score 06/02/22 1704 8     Pain Loc --      Pain Edu? --      Excl. in [place]? --     Most recent vital signs: Vitals:   06/02/22 1707  BP: (!) 143/93  Pulse: 90  Resp: 20  Temp: 98.3 F (36.8 C)  SpO2: 98%     General: Awake, no distress.  Patient is obese CV:  Good peripheral perfusion.  Resp:  Normal effort.  Abd:  No distention.  Neuro:             Awake, Alert, Oriented x 3  Other:  No asymmetry of the knees, no effusion, mild tenderness in the medial and lateral joint line, patient is able to range but with some discomfort, no valgus or varus laxity 2+ DP pulse, intact plantarflexion dorsiflexion bilaterally patient is able to bear weight and ambulate No significant pitting edema or asymmetry of the  calves   ED Results / Procedures / Treatments  Labs (all labs ordered are listed, but only abnormal results are displayed) Labs Reviewed - No data to display   EKG     RADIOLOGY    PROCEDURES:  Critical Care performed: No  Procedures   MEDICATIONS ORDERED IN ED: Medications  oxyCODONE (Oxy IR/ROXICODONE) immediate release tablet 5 mg (has no administration in time range)     IMPRESSION / MDM / ASSESSMENT AND PLAN / ED COURSE  I reviewed the triage vital signs and the nursing notes.                              Patient's presentation is most consistent with exacerbation of chronic illness.  Differential diagnosis includes, but is not limited to, osteoarthritis, meniscal tear, patellofemoral syndrome, less likely DVT  Patient is a 42 year old female presents with chronic atraumatic right knee pain.  This has  been going on for 2 years and is worse with bending the knee and standing.  Pain worsened today at work and made her in tears.  She does feel like the leg and the knee are swollen.  On exam the right knee is tender on the medial lateral joint line but she is able to range she is neurovascular intact able to bear weight.  There is no significant effusion no warmth or redness to suggest infection.  The actual calf is not swollen I have low suspicion for DVT she has good pulses.  Suspect likely chronic pain related to osteoarthritis versus meniscal injury versus patellofemoral syndrome.  Given no trauma do not feel x-rays will be useful.  Recommended RICE as well as follow-up with orthopedics.  Patient does not currently have medical insurance.  I referred her to our clinic controlled through.       FINAL CLINICAL IMPRESSION(S) / ED DIAGNOSES   Final diagnoses:  Chronic pain of right knee     Rx / DC Orders   ED Discharge Orders     None        Note:  This document was prepared using Dragon voice recognition software and may include unintentional  dictation errors.   Rada Hay, MD 06/02/22 [phone]

## 2022-06-02 NOTE — Discharge Instructions (Signed)
Continue to take the Motrin rest elevate and ice the knee.  Please follow-up with one of the clinics above.

## 2022-06-02 NOTE — ED Triage Notes (Addendum)
Pt has right upper leg pain and right knee pain.  No known injury.  Pain for 1 year, worse today.  Pt ambulates with a limp.  Pt works in [employer] and is on her legs a lot per pt.  Pt alert  speech clear.

## 2022-10-17 ENCOUNTER — Other Ambulatory Visit: Payer: Self-pay

## 2022-10-17 ENCOUNTER — Emergency Department: Payer: 59

## 2022-10-17 ENCOUNTER — Emergency Department
Admission: EM | Admit: 2022-10-17 | Discharge: 2022-10-17 | Disposition: A | Discharge status: Home / Self Care | Payer: 59 | Attending: Emergency Medicine | Admitting: Emergency Medicine

## 2022-10-17 DIAGNOSIS — R0602 Shortness of breath: Secondary | ICD-10-CM | POA: Diagnosis not present

## 2022-10-17 DIAGNOSIS — R079 Chest pain, unspecified: Secondary | ICD-10-CM | POA: Diagnosis not present

## 2022-10-17 DIAGNOSIS — J9801 Acute bronchospasm: Secondary | ICD-10-CM

## 2022-10-17 LAB — BASIC METABOLIC PANEL
Anion gap: 9 (ref 5–15)
BUN: 11 mg/dL (ref 6–20)
CO2: 26 mmol/L (ref 22–32)
Calcium: 8.8 mg/dL — ABNORMAL LOW (ref 8.9–10.3)
Chloride: 101 mmol/L (ref 98–111)
Creatinine, Ser: 0.87 mg/dL (ref 0.44–1.00)
GFR, Estimated: >60 mL/min (ref >60)
Glucose, Bld: 116 mg/dL — ABNORMAL HIGH (ref 70–99)
Potassium: 3.7 mmol/L (ref 3.5–5.1)
Sodium: 136 mmol/L (ref 135–145)

## 2022-10-17 LAB — CBC
HCT: 39.3 % (ref 36.0–46.0)
Hemoglobin: 13 g/dL (ref 12.0–15.0)
MCH: 28.6 pg (ref 26.0–34.0)
MCHC: 33.1 g/dL (ref 30.0–36.0)
MCV: 86.4 fL (ref 80.0–100.0)
Platelets: 354 10*3/uL (ref 150–400)
RBC: 4.55 MIL/uL (ref 3.87–5.11)
RDW: 14.6 % (ref 11.5–15.5)
WBC: 10.1 10*3/uL (ref 4.0–10.5)
nRBC: 0 % (ref 0.0–0.2)

## 2022-10-17 LAB — TROPONIN I (HIGH SENSITIVITY): Troponin I (High Sensitivity): 6 ng/L (ref <18)

## 2022-10-17 MED ORDER — IPRATROPIUM-ALBUTEROL 0.5-2.5 (3) MG/3ML IN SOLN
3.0000 mL | Freq: Once | RESPIRATORY_TRACT | Status: AC
  Administered 2022-10-17: 3 mL via RESPIRATORY_TRACT
  Filled 2022-10-17: qty 3

## 2022-10-17 MED ORDER — PREDNISONE 50 MG PO TABS: 50.0000 mg | ORAL_TABLET | Freq: Every day | ORAL | 0 refills | Status: DC

## 2022-10-17 MED ORDER — ALBUTEROL SULFATE HFA 108 (90 BASE) MCG/ACT IN AERS: 2.0000 | INHALATION_SPRAY | Freq: Four times a day (QID) | RESPIRATORY_TRACT | 2 refills | Status: AC | PRN

## 2022-10-17 MED ORDER — PREDNISONE 20 MG PO TABS
60.0000 mg | ORAL_TABLET | Freq: Once | ORAL | Status: AC
  Administered 2022-10-17: 60 mg via ORAL
  Filled 2022-10-17: qty 3

## 2022-10-17 NOTE — ED Triage Notes (Signed)
Pt comes with c/o cp and sob. Pt states this all stared last night when she was laying down. Pt states 8-10 pain. Pt denies any N/V. Pt denies any hx of this.

## 2022-10-17 NOTE — ED Provider Notes (Signed)
[facility] Provider Note    Event Date/Time   First MD Initiated Contact with Patient 10/17/22 1220     (approximate)   History   Shortness of Breath and Chest Pain   HPI  Betty Frazier is a 42 y.o. female with no significant past medical history presents with complaints of shortness of breath with wheezing and chest tightness.  She reports this started yesterday and seem to worsen throughout the night.  She denies fevers chills or cough.  No history of asthma.  She does not smoke cigarettes.  No calf pain or swelling     Physical Exam   Triage Vital Signs: ED Triage Vitals  Encounter Vitals Group     BP 10/17/22 0932 (!) 145/75     Systolic BP Percentile --      Diastolic BP Percentile --      Pulse Rate 10/17/22 0932 (!) 114     Resp 10/17/22 0932 (!) 22     Temp 10/17/22 0932 98.6 F (37 C)     Temp Source 10/17/22 1332 Oral     SpO2 10/17/22 0932 90 %     Weight 10/17/22 1239 136 kg (299 lb 13.2 oz)     Height 10/17/22 1239 1.702 m (5\' 7" )     Head Circumference --      Peak Flow --      Pain Score 10/17/22 0931 8     Pain Loc --      Pain Education --      Exclude from Growth Chart --     Most recent vital signs: Vitals:   10/17/22 0932 10/17/22 1332  BP: (!) 145/75 (!) 140/70  Pulse: (!) 114 (!) 108  Resp: (!) 22 20  Temp: 98.6 F (37 C) 98 F (36.7 C)  SpO2: 90% 94%     General: Awake, no distress.  CV:  Good peripheral perfusion.  Resp:  Normal effort.  Scattered wheezing on exam Abd:  No distention.  Other:  No calf pain or swelling   ED Results / Procedures / Treatments   Labs (all labs ordered are listed, but only abnormal results are displayed) Labs Reviewed  BASIC METABOLIC PANEL - Abnormal; Notable for the following components:      Result Value   Glucose, Bld 116 (*)    Calcium 8.8 (*)    All other components within normal limits  CBC  TROPONIN I (HIGH SENSITIVITY)     EKG  ED ECG REPORT I,  Jene Every, the attending physician, personally viewed and interpreted this ECG.  Date: 10/17/2022  Rhythm: Sinus tachycardia QRS Axis: normal Intervals: normal ST/T Wave abnormalities: normal Narrative Interpretation: no evidence of acute ischemia    RADIOLOGY Chest x-ray interpret by me, no pneumonia    PROCEDURES:  Critical Care performed:   Procedures   MEDICATIONS ORDERED IN ED: Medications  ipratropium-albuterol (DUONEB) 0.5-2.5 (3) MG/3ML nebulizer solution 3 mL (3 mLs Nebulization Given 10/17/22 1316)  ipratropium-albuterol (DUONEB) 0.5-2.5 (3) MG/3ML nebulizer solution 3 mL (3 mLs Nebulization Given 10/17/22 1316)  predniSONE (DELTASONE) tablet 60 mg (60 mg Oral Given 10/17/22 1317)     IMPRESSION / MDM / ASSESSMENT AND PLAN / ED COURSE  I reviewed the triage vital signs and the nursing notes. Patient's presentation is most consistent with acute presentation with potential threat to life or bodily function.  Patient presents with shortness of breath and chest tightness as above, EKG is overall reassuring.  She  does have wheezing on exam which raises suspicion for bronchospasm, bronchitis, less likely pneumonia, CHF  Chest x-ray is reassuring, no infiltrates, effusion or edema  Patient treated with DuoNebs and p.o. prednisone with significant improvement in her symptoms.  This is consistent with bronchospasm.  No indication for admission at this time, appropriate for discharge with outpatient follow-up.  Will Rx prednisone, albuterol inhaler, strict return precautions, patient agrees with this plan.        FINAL CLINICAL IMPRESSION(S) / ED DIAGNOSES   Final diagnoses:  Bronchospasm     Rx / DC Orders   ED Discharge Orders          Ordered    predniSONE (DELTASONE) 50 MG tablet  Daily with breakfast        10/17/22 1354    albuterol (VENTOLIN HFA) 108 (90 Base) MCG/ACT inhaler  Every 6 hours PRN        10/17/22 1354             Note:  This  document was prepared using Dragon voice recognition software and may include unintentional dictation errors.   Jene Every, MD 10/17/22 [phone]

## 2023-09-14 ENCOUNTER — Ambulatory Visit: Admitting: Family Medicine

## 2023-12-31 ENCOUNTER — Emergency Department
Admission: EM | Admit: 2023-12-31 | Discharge: 2023-12-31 | Disposition: A | Discharge status: Home / Self Care | Attending: Emergency Medicine | Admitting: Emergency Medicine

## 2023-12-31 DIAGNOSIS — M70831 Other soft tissue disorders related to use, overuse and pressure, right forearm: Secondary | ICD-10-CM | POA: Diagnosis not present

## 2023-12-31 DIAGNOSIS — Y9389 Activity, other specified: Secondary | ICD-10-CM | POA: Insufficient documentation

## 2023-12-31 DIAGNOSIS — G5611 Other lesions of median nerve, right upper limb: Secondary | ICD-10-CM | POA: Insufficient documentation

## 2023-12-31 DIAGNOSIS — M79601 Pain in right arm: Secondary | ICD-10-CM | POA: Diagnosis present

## 2023-12-31 MED ORDER — NAPROXEN 500 MG PO TABS: 500.0000 mg | ORAL_TABLET | Freq: Two times a day (BID) | ORAL | 0 refills | Status: AC

## 2023-12-31 MED ORDER — CYCLOBENZAPRINE HCL 5 MG PO TABS: 5.0000 mg | ORAL_TABLET | Freq: Every day | ORAL | 0 refills | Status: AC

## 2023-12-31 NOTE — ED Provider Notes (Signed)
    [facility] Emergency Department Provider Note     Event Date/Time   First MD Initiated Contact with Patient 12/31/23 1715     (approximate)   History   Arm Pain   HPI  Betty Frazier is a 43 y.o. right-handed female with a history of     Physical Exam   Triage Vital Signs: ED Triage Vitals [12/31/23 1710]  Encounter Vitals Group     BP (!) 173/97     Girls Systolic BP Percentile      Girls Diastolic BP Percentile      Boys Systolic BP Percentile      Boys Diastolic BP Percentile      Pulse Rate 78     Resp 18     Temp 98.5 F (36.9 C)     Temp Source Oral     SpO2 97 %     Weight (!) 330 lb (149.7 kg)     Height 5' 6 (1.676 m)     Head Circumference      Peak Flow      Pain Score 7     Pain Loc      Pain Education      Exclude from Growth Chart     Most recent vital signs: Vitals:   12/31/23 1710  BP: (!) 173/97  Pulse: 78  Resp: 18  Temp: 98.5 F (36.9 C)  SpO2: 97%    General Awake, no distress. *** {**HEENT NCAT. PERRL. EOMI. No rhinorrhea. Mucous membranes are moist. **} CV:  Good peripheral perfusion. *** RESP:  Normal effort. *** MSK:  *** NEURO: ***   ED Results / Procedures / Treatments   Labs (all labs ordered are listed, but only abnormal results are displayed) Labs Reviewed - No data to display   EKG   RADIOLOGY  No results found.   PROCEDURES:  Critical Care performed: No  Procedures   MEDICATIONS ORDERED IN ED: Medications - No data to display   IMPRESSION / MDM / ASSESSMENT AND PLAN / ED COURSE  I reviewed the triage vital signs and the nursing notes.                              Differential diagnosis includes, but is not limited to, myalgias, paresthesias, peripheral neuropathy  Patient's presentation is most consistent with acute, uncomplicated illness.  Patient's diagnosis is consistent with ***. Patient will be discharged home with prescriptions for ***. Patient is  to follow up with *** as needed or otherwise directed. Patient is given ED precautions to return to the ED for any worsening or new symptoms.     FINAL CLINICAL IMPRESSION(S) / ED DIAGNOSES   Final diagnoses:  None     Rx / DC Orders   ED Discharge Orders     None        Note:  This document was prepared using Dragon voice recognition software and may include unintentional dictation errors.

## 2023-12-31 NOTE — ED Triage Notes (Signed)
 Pt presents to the ED via POV from home with right arm pain. Pt states that this has been going on for about 2 weeks. Pt states that her forearm will occasionally have some numbness in it when she wakes up. Pt states that the pain is worse in the mornings after waking up. Pt denies injury or trauma. Pt reports increased pain with movement or palpation. Pt states that she usually sleeps on her right side.

## 2023-12-31 NOTE — Discharge Instructions (Addendum)
 Your symptoms of the forearm muscle strain and hand weakness may be due to repetitive work-related activities.  Wear the wrist brace as suggested.  Take the prescription anti-inflammatory daily and the muscle relaxant at bedtime.  Follow-up with the hand specialist and orthopedics as suggested for persistent symptoms.

## 2024-01-12 ENCOUNTER — Emergency Department

## 2024-01-12 ENCOUNTER — Other Ambulatory Visit: Payer: Self-pay

## 2024-01-12 ENCOUNTER — Emergency Department
Admission: EM | Admit: 2024-01-12 | Discharge: 2024-01-12 | Disposition: A | Discharge status: Home / Self Care | Attending: Emergency Medicine | Admitting: Emergency Medicine

## 2024-01-12 DIAGNOSIS — K0889 Other specified disorders of teeth and supporting structures: Secondary | ICD-10-CM | POA: Diagnosis present

## 2024-01-12 DIAGNOSIS — K047 Periapical abscess without sinus: Secondary | ICD-10-CM | POA: Insufficient documentation

## 2024-01-12 LAB — CBC WITH DIFFERENTIAL/PLATELET
Abs Immature Granulocytes: 0.03 K/uL (ref 0.00–0.07)
Basophils Absolute: 0 K/uL (ref 0.0–0.1)
Basophils Relative: 0 %
Eosinophils Absolute: 0.5 K/uL (ref 0.0–0.5)
Eosinophils Relative: 5 %
HCT: 38.9 % (ref 36.0–46.0)
Hemoglobin: 12.8 g/dL (ref 12.0–15.0)
Immature Granulocytes: 0 %
Lymphocytes Relative: 20 %
Lymphs Abs: 1.9 K/uL (ref 0.7–4.0)
MCH: 28.5 pg (ref 26.0–34.0)
MCHC: 32.9 g/dL (ref 30.0–36.0)
MCV: 86.6 fL (ref 80.0–100.0)
Monocytes Absolute: 0.8 K/uL (ref 0.1–1.0)
Monocytes Relative: 8 %
Neutro Abs: 6.6 K/uL (ref 1.7–7.7)
Neutrophils Relative %: 67 %
Platelets: 253 K/uL (ref 150–400)
RBC: 4.49 MIL/uL (ref 3.87–5.11)
RDW: 15.3 % (ref 11.5–15.5)
WBC: 9.8 K/uL (ref 4.0–10.5)
nRBC: 0 % (ref 0.0–0.2)

## 2024-01-12 LAB — BASIC METABOLIC PANEL WITH GFR
Anion gap: 7 (ref 5–15)
BUN: 13 mg/dL (ref 6–20)
CO2: 25 mmol/L (ref 22–32)
Calcium: 8.5 mg/dL — ABNORMAL LOW (ref 8.9–10.3)
Chloride: 104 mmol/L (ref 98–111)
Creatinine, Ser: 0.82 mg/dL (ref 0.44–1.00)
GFR, Estimated: >60 mL/min (ref >60)
Glucose, Bld: 104 mg/dL — ABNORMAL HIGH (ref 70–99)
Potassium: 4 mmol/L (ref 3.5–5.1)
Sodium: 136 mmol/L (ref 135–145)

## 2024-01-12 LAB — POC URINE PREG, ED: Preg Test, Ur: NEGATIVE

## 2024-01-12 MED ORDER — AMOXICILLIN-POT CLAVULANATE 875-125 MG PO TABS: 1.0000 | ORAL_TABLET | Freq: Two times a day (BID) | ORAL | 0 refills | Status: AC

## 2024-01-12 MED ORDER — KETOROLAC TROMETHAMINE 15 MG/ML IJ SOLN
15.0000 mg | Freq: Once | INTRAMUSCULAR | Status: AC
  Administered 2024-01-12: 15 mg via INTRAVENOUS
  Filled 2024-01-12: qty 1

## 2024-01-12 MED ORDER — IOHEXOL 300 MG/ML  SOLN
75.0000 mL | Freq: Once | INTRAMUSCULAR | Status: AC | PRN
  Administered 2024-01-12: 75 mL via INTRAVENOUS

## 2024-01-12 MED ORDER — DEXAMETHASONE SOD PHOSPHATE PF 10 MG/ML IJ SOLN
10.0000 mg | Freq: Once | INTRAMUSCULAR | Status: AC
  Administered 2024-01-12: 10 mg via INTRAVENOUS

## 2024-01-12 MED ORDER — SODIUM CHLORIDE 0.9 % IV SOLN
3.0000 g | Freq: Once | INTRAVENOUS | Status: AC
  Administered 2024-01-12: 3 g via INTRAVENOUS
  Filled 2024-01-12: qty 8

## 2024-01-12 NOTE — ED Notes (Signed)
 PT in no acute distress prior to discharge. Discharged instructions reviewed, all questions answered and pt verbalized understanding at this time. Pt has all belongings with them at time of discharge.

## 2024-01-12 NOTE — ED Provider Notes (Signed)
 [facility] Provider Note    Event Date/Time   First MD Initiated Contact with Patient 01/12/24 [phone]     (approximate)   History   Dental Pain   HPI  Betty Frazier is a 43 y.o. female   Past medical history of hypertension who presents to the emergency department with dental pain and swelling underneath the left jaw.  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  Then noticed swelling underneath the jawline and left side.  Has been having trouble reaching a dentist  No fever.  Does not hurt to swallow.  No difficulty with breathing.  Independent Historian contributed to assessment above: Mother corroborates information above     Physical Exam   Triage Vital Signs: ED Triage Vitals [01/12/24 0337]  Encounter Vitals Group     BP (!) 171/126     Girls Systolic BP Percentile      Girls Diastolic BP Percentile      Boys Systolic BP Percentile      Boys Diastolic BP Percentile      Pulse Rate 94     Resp 18     Temp 98 F (36.7 C)     Temp src      SpO2 99 %     Weight      Height      Head Circumference      Peak Flow      Pain Score 10     Pain Loc      Pain Education      Exclude from Growth Chart     Most recent vital signs: Vitals:   01/12/24 0345 01/12/24 0430  BP: (!) 165/94 (!) 171/98  Pulse:    Resp:    Temp:    SpO2:      General: Awake, no distress.  CV:  Good peripheral perfusion.  Resp:  Normal effort.  Abd:  No distention.  Other:  Given body habitus difficult to ascertain submandibular swelling or cervical lymphadenopathy but does have mild tenderness to palpation under the submandibular area of the left side compared to the right.  Intraorally there appears to be poor dental hygiene especially affecting the left bottom incisor with dental caries, but otherwise posterior oropharynx appears normal with no lesions masses or exudates, uvula  midline, no intraoral abscess noted.  She has little bit of dysphonia, as noted by her mother who states her voice sounds a little bit different.  No respiratory distress no airway obstruction.   ED Results / Procedures / Treatments   Labs (all labs ordered are listed, but only abnormal results are displayed) Labs Reviewed  BASIC METABOLIC PANEL WITH GFR - Abnormal; Notable for the following components:      Result Value   Glucose, Bld 104 (*)    Calcium 8.5 (*)    All other components within normal limits  CBC WITH DIFFERENTIAL/PLATELET  POC URINE PREG, ED     I ordered and reviewed the above labs they are notable for no leukocytosis   RADIOLOGY I independently reviewed and interpreted CT soft tissue neck and see no obvious fluid collection I also reviewed radiologist's formal read.   PROCEDURES:  Critical Care performed: No  Procedures   MEDICATIONS ORDERED IN ED: Medications  dexamethasone (DECADRON) injection 10 mg (10 mg Intravenous Given 01/12/24 0411)  Ampicillin-Sulbactam (UNASYN) 3 g in sodium chloride 0.9 %  100 mL IVPB (0 g Intravenous Stopped 01/12/24 0442)  ketorolac  (TORADOL ) 15 MG/ML injection 15 mg (15 mg Intravenous Given 01/12/24 0411)  iohexol (OMNIPAQUE) 300 MG/ML solution 75 mL (75 mLs Intravenous Contrast Given 01/12/24 0448)     IMPRESSION / MDM / ASSESSMENT AND PLAN / ED COURSE  I reviewed the triage vital signs and the nursing notes.                                Patient's presentation is most consistent with acute presentation with potential threat to life or bodily function.  Differential diagnosis includes, but is not limited to, dental infection, submandibular abscess, airway obstruction or Ludwig angina   The patient is on the cardiac monitor to evaluate for evidence of arrhythmia and/or significant heart rate changes.  MDM:   I think odontogenic infection with poor dental hygiene and dental pain leading to swelling in the neck  area suspicious for abscess better evaluated on CT scan given difficult clinical evaluation due to body habitus.  No evidence of airway obstruction as she is breathing comfortably but her phonation has changed, she is slightly tender in the submandibular area, I think warrants close observation and CT scan in case of developing airway obstruction, abscess requiring drainage, etc.  Symptoms may very well be due to also to uncomplicated dental infection and cervical lymphadenopathy   Will give IV dexamethasone, Unasyn, Toradol .  I considered hospitalization for admission or observation however given unremarkable CT, airway intact, improvement in symptoms, I think she is appropriate for discharge at this time with close dental follow-up, PMD follow-up, and strict return precautions for any worsening        FINAL CLINICAL IMPRESSION(S) / ED DIAGNOSES   Final diagnoses:  Dental infection     Rx / DC Orders   ED Discharge Orders          Ordered    amoxicillin -clavulanate (AUGMENTIN ) 875-125 MG tablet  2 times daily        01/12/24 0531             Note:  This document was prepared using Dragon voice recognition software and may include unintentional dictation errors.    Cyrena Mylar, MD 01/12/24 [phone]

## 2024-01-12 NOTE — ED Triage Notes (Addendum)
 POV from home C/O left lower jaw pain and swelling x 4 days. Patient reports she has a known cavity in one of her teeth in that area. Denies any broken or missing teeth. Took tylenol  around 2030 last night without relief.

## 2024-01-12 NOTE — Discharge Instructions (Addendum)
 Take acetaminophen  650 mg and ibuprofen 400 mg every 6 hours for pain.  Take with food. Take Augmentin  antibiotic for the full 10-day course as prescribed.  See the attached dental resources to call a dentist for an appointment as soon as possible.  Fortunately your CT scan shows some reactive lymph nodes but no abscess that would require emergency drainage.  Should you develop increased swelling, difficulty breathing, difficulty swallowing, or any other new, worsening, or unexpected symptoms call your doctor right away or come back to the emergency department for reevaluation.  Thank you for choosing us  for your health care today!  Please see your primary doctor this week for a follow up appointment.   If you have any new, worsening, or unexpected symptoms call your doctor right away or come back to the emergency department for reevaluation.  It was my pleasure to care for you today.   Ginnie EDISON Cyrena, MD
